# Patient Record
Sex: Female | Born: 1985 | Race: White | Hispanic: No | Marital: Single | Smoking: Former smoker
Health system: Southern US, Community
[De-identification: ages and names within clinical notes are randomized; demographics above are authoritative.]

## PROBLEM LIST (undated history)

## (undated) DIAGNOSIS — T4145XA Adverse effect of unspecified anesthetic, initial encounter: Secondary | ICD-10-CM

## (undated) DIAGNOSIS — T8859XA Other complications of anesthesia, initial encounter: Secondary | ICD-10-CM

## (undated) DIAGNOSIS — Z87891 Personal history of nicotine dependence: Secondary | ICD-10-CM

## (undated) DIAGNOSIS — H913 Deaf nonspeaking, not elsewhere classified: Secondary | ICD-10-CM

## (undated) HISTORY — DX: Deaf nonspeaking, not elsewhere classified: H91.3

## (undated) HISTORY — DX: Adverse effect of unspecified anesthetic, initial encounter: T41.45XA

## (undated) HISTORY — DX: Other complications of anesthesia, initial encounter: T88.59XA

---
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
## 2007-03-14 ENCOUNTER — Other Ambulatory Visit: Admission: RE | Admit: 2007-03-14 | Discharge: 2007-03-14 | Payer: Self-pay | Admitting: Family Medicine

## 2008-04-11 ENCOUNTER — Other Ambulatory Visit: Admission: RE | Admit: 2008-04-11 | Discharge: 2008-04-11 | Payer: Self-pay | Admitting: Family Medicine

## 2009-03-19 ENCOUNTER — Other Ambulatory Visit: Admission: RE | Admit: 2009-03-19 | Discharge: 2009-03-19 | Payer: Self-pay | Admitting: Gynecology

## 2009-03-19 ENCOUNTER — Encounter: Payer: Self-pay | Admitting: Gynecology

## 2009-03-19 ENCOUNTER — Ambulatory Visit: Payer: Self-pay | Admitting: Gynecology

## 2009-04-14 ENCOUNTER — Ambulatory Visit: Payer: Self-pay | Admitting: Gynecology

## 2011-02-19 ENCOUNTER — Encounter (INDEPENDENT_AMBULATORY_CARE_PROVIDER_SITE_OTHER): Payer: BC Managed Care – PPO | Admitting: Gynecology

## 2011-02-19 ENCOUNTER — Other Ambulatory Visit (HOSPITAL_COMMUNITY)
Admission: RE | Admit: 2011-02-19 | Discharge: 2011-02-19 | Disposition: A | Discharge status: Home / Self Care | Payer: BC Managed Care – PPO | Source: Ambulatory Visit | Attending: Gynecology | Admitting: Gynecology

## 2011-02-19 ENCOUNTER — Other Ambulatory Visit: Payer: Self-pay | Admitting: Gynecology

## 2011-02-19 DIAGNOSIS — R635 Abnormal weight gain: Secondary | ICD-10-CM

## 2011-02-19 DIAGNOSIS — B373 Candidiasis of vulva and vagina: Secondary | ICD-10-CM

## 2011-02-19 DIAGNOSIS — Z13220 Encounter for screening for lipoid disorders: Secondary | ICD-10-CM

## 2011-02-19 DIAGNOSIS — Z124 Encounter for screening for malignant neoplasm of cervix: Secondary | ICD-10-CM | POA: Insufficient documentation

## 2011-02-19 DIAGNOSIS — Z833 Family history of diabetes mellitus: Secondary | ICD-10-CM

## 2011-02-19 DIAGNOSIS — R82998 Other abnormal findings in urine: Secondary | ICD-10-CM

## 2011-02-19 DIAGNOSIS — Z01419 Encounter for gynecological examination (general) (routine) without abnormal findings: Secondary | ICD-10-CM

## 2011-02-22 ENCOUNTER — Encounter: Payer: Self-pay | Admitting: *Deleted

## 2012-02-21 ENCOUNTER — Other Ambulatory Visit: Payer: Self-pay | Admitting: Gynecology

## 2012-02-21 ENCOUNTER — Encounter: Payer: Self-pay | Admitting: Gynecology

## 2012-02-21 ENCOUNTER — Ambulatory Visit (INDEPENDENT_AMBULATORY_CARE_PROVIDER_SITE_OTHER): Payer: BC Managed Care – PPO | Admitting: Gynecology

## 2012-02-21 VITALS — Ht 65.0 in | Wt 183.0 lb

## 2012-02-21 DIAGNOSIS — IMO0001 Reserved for inherently not codable concepts without codable children: Secondary | ICD-10-CM

## 2012-02-21 DIAGNOSIS — Z309 Encounter for contraceptive management, unspecified: Secondary | ICD-10-CM

## 2012-02-21 DIAGNOSIS — Z113 Encounter for screening for infections with a predominantly sexual mode of transmission: Secondary | ICD-10-CM

## 2012-02-21 DIAGNOSIS — Z30432 Encounter for removal of intrauterine contraceptive device: Secondary | ICD-10-CM

## 2012-02-21 LAB — RPR

## 2012-02-21 LAB — HIV ANTIBODY (ROUTINE TESTING W REFLEX): HIV: NONREACTIVE

## 2012-02-21 LAB — HEPATITIS C ANTIBODY: HCV Ab: NEGATIVE

## 2012-02-21 NOTE — Patient Instructions (Signed)

## 2012-02-21 NOTE — Progress Notes (Signed)
Patient is a 26 year old presented to the office today requesting to have her Mirena IUD removed which had been placed in 2010. Patient wanted discuss alternative forms of contraception. There was question  in the past whether she had an allergic reaction to oral contraceptive pill but it is not clear. She has done well with a Mirena otherwise but states that she been to the emergency room several times in [place] where she goes to school and was found that she had ovarian cyst. Patient also wanted to have an STD screen today.  Exam: Bartholin urethra Skene glands: Within normal limits Vagina: No gross lesions on inspection Cervix: Friable IUD string seen Uterus: Anteverted normal size shape and consistency Adnexa: No palpable masses or tenderness Rectal: Not examined  The cervix was cleansed with Betadine solution and a [place] clamp was used to grasp the IUD string was retrieved and shown to the patient and discarded.  A wet prep along with a GC and Chlamydia culture were obtained as well as urinalysis. The wet prep was otherwise negative and GC and Chlamydia culture pending at time of this dictation  An HIV, RPR, hepatitis B and C. will be drawn as well to complete the STD screen. Will notify the patient with the results when it becomes available and in the next couple days. Her urinalysis today: 7-10 RBC and few bacteria.  We discussed the NuvaRing although it does have estrogen and she is willing to give it a try and see. Instructions were provided on insertion and removal which she did in the office as well also literature information was provided. She is due for a Pap smear and next year. 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

## 2012-02-22 LAB — WET PREP FOR TRICH, YEAST, CLUE: Clue Cells Wet Prep HPF POC: NONE SEEN

## 2012-02-22 LAB — URINALYSIS W MICROSCOPIC + REFLEX CULTURE
Casts: NONE SEEN
Glucose, UA: NEGATIVE mg/dL
Leukocytes, UA: NEGATIVE
Urobilinogen, UA: 0.2 mg/dL (ref 0.0–1.0)
WBC, UA: NONE SEEN WBC/hpf (ref <3)

## 2012-02-24 LAB — URINE CULTURE
Colony Count: NO GROWTH
Organism ID, Bacteria: NO GROWTH

## 2013-03-15 ENCOUNTER — Ambulatory Visit (INDEPENDENT_AMBULATORY_CARE_PROVIDER_SITE_OTHER): Payer: BC Managed Care – PPO | Admitting: Gynecology

## 2013-03-15 ENCOUNTER — Other Ambulatory Visit (HOSPITAL_COMMUNITY)
Admission: RE | Admit: 2013-03-15 | Discharge: 2013-03-15 | Disposition: A | Discharge status: Home / Self Care | Payer: BC Managed Care – PPO | Source: Ambulatory Visit | Attending: Gynecology | Admitting: Gynecology

## 2013-03-15 ENCOUNTER — Encounter: Payer: Self-pay | Admitting: Gynecology

## 2013-03-15 VITALS — BP 122/70 | Ht 65.0 in | Wt 181.0 lb

## 2013-03-15 DIAGNOSIS — Z3009 Encounter for other general counseling and advice on contraception: Secondary | ICD-10-CM

## 2013-03-15 DIAGNOSIS — Z01419 Encounter for gynecological examination (general) (routine) without abnormal findings: Secondary | ICD-10-CM

## 2013-03-15 DIAGNOSIS — Z113 Encounter for screening for infections with a predominantly sexual mode of transmission: Secondary | ICD-10-CM

## 2013-03-15 LAB — WET PREP FOR TRICH, YEAST, CLUE
Clue Cells Wet Prep HPF POC: NONE SEEN
Trich, Wet Prep: NONE SEEN

## 2013-03-15 NOTE — Addendum Note (Signed)
Addended by: Bertram Savin A on: 03/15/2013 03:17 PM   Modules accepted: Orders

## 2013-03-15 NOTE — Patient Instructions (Addendum)

## 2013-03-15 NOTE — Addendum Note (Signed)
Addended by: Bertram Savin A on: 03/15/2013 02:49 PM   Modules accepted: Orders
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
## 2013-03-16 LAB — URINALYSIS W MICROSCOPIC + REFLEX CULTURE
Casts: NONE SEEN
Crystals: NONE SEEN
Glucose, UA: NEGATIVE mg/dL
Hgb urine dipstick: NEGATIVE
Ketones, ur: NEGATIVE mg/dL
Nitrite: NEGATIVE
Protein, ur: NEGATIVE mg/dL
Specific Gravity, Urine: 1.019 (ref 1.005–1.030)
Urobilinogen, UA: 0.2 mg/dL (ref 0.0–1.0)
pH: 7 (ref 5.0–8.0)

## 2013-03-16 LAB — CBC WITH DIFFERENTIAL/PLATELET
Eosinophils Relative: 1 % (ref 0–5)
HCT: 42.3 % (ref 36.0–46.0)
MCH: 29.8 pg (ref 26.0–34.0)
MCV: 85.1 fL (ref 78.0–100.0)
Neutrophils Relative %: 56 % (ref 43–77)
Platelets: 185 10*3/uL (ref 150–400)

## 2013-03-21 ENCOUNTER — Encounter: Payer: Self-pay | Admitting: Gynecology

## 2013-06-21 ENCOUNTER — Other Ambulatory Visit: Payer: Self-pay

## 2014-08-30 ENCOUNTER — Encounter: Payer: Self-pay | Admitting: Gynecology

## 2014-08-30 ENCOUNTER — Ambulatory Visit (INDEPENDENT_AMBULATORY_CARE_PROVIDER_SITE_OTHER): Payer: PPO | Admitting: Gynecology

## 2014-08-30 VITALS — BP 128/76

## 2014-08-30 DIAGNOSIS — Z3200 Encounter for pregnancy test, result unknown: Secondary | ICD-10-CM

## 2014-08-30 DIAGNOSIS — O26859 Spotting complicating pregnancy, unspecified trimester: Secondary | ICD-10-CM

## 2014-08-30 DIAGNOSIS — O26851 Spotting complicating pregnancy, first trimester: Secondary | ICD-10-CM

## 2014-08-30 DIAGNOSIS — N912 Amenorrhea, unspecified: Secondary | ICD-10-CM

## 2014-08-30 LAB — PREGNANCY, URINE: PREG TEST UR: POSITIVE

## 2014-08-30 NOTE — Patient Instructions (Signed)
First Trimester of Pregnancy The first trimester of pregnancy is from week 1 until the end of week 12 (months 1 through 3). A week after a sperm fertilizes an egg, the egg will implant on the wall of the uterus. This embryo will begin to develop into a baby. Genes from you and your partner are forming the baby. The female genes determine whether the baby is a boy or a girl. At 6-8 weeks, the eyes and face are formed, and the heartbeat can be seen on ultrasound. At the end of 12 weeks, all the baby's organs are formed.  Now that you are pregnant, you will want to do everything you can to have a healthy baby. Two of the most important things are to get good prenatal care and to follow your health care provider's instructions. Prenatal care is all the medical care you receive before the baby's birth. This care will help prevent, find, and treat any problems during the pregnancy and childbirth. BODY CHANGES Your body goes through many changes during pregnancy. The changes vary from woman to woman.   You may gain or lose a couple of pounds at first.  You may feel sick to your stomach (nauseous) and throw up (vomit). If the vomiting is uncontrollable, call your health care provider.  You may tire easily.  You may develop headaches that can be relieved by medicines approved by your health care provider.  You may urinate more often. Painful urination may mean you have a bladder infection.  You may develop heartburn as a result of your pregnancy.  You may develop constipation because certain hormones are causing the muscles that push waste through your intestines to slow down.  You may develop hemorrhoids or swollen, bulging veins (varicose veins).  Your breasts may begin to grow larger and become tender. Your nipples may stick out more, and the tissue that surrounds them (areola) may become darker.  Your gums may bleed and may be sensitive to brushing and flossing.  Dark spots or blotches (chloasma,  mask of pregnancy) may develop on your face. This will likely fade after the baby is born.  Your menstrual periods will stop.  You may have a loss of appetite.  You may develop cravings for certain kinds of food.  You may have changes in your emotions from day to day, such as being excited to be pregnant or being concerned that something may go wrong with the pregnancy and baby.  You may have more vivid and strange dreams.  You may have changes in your hair. These can include thickening of your hair, rapid growth, and changes in texture. Some women also have hair loss during or after pregnancy, or hair that feels dry or thin. Your hair will most likely return to normal after your baby is born. WHAT TO EXPECT AT YOUR PRENATAL VISITS During a routine prenatal visit:  You will be weighed to make sure you and the baby are growing normally.  Your blood pressure will be taken.  Your abdomen will be measured to track your baby's growth.  The fetal heartbeat will be listened to starting around week 10 or 12 of your pregnancy.  Test results from any previous visits will be discussed. Your health care provider may ask you:  How you are feeling.  If you are feeling the baby move.  If you have had any abnormal symptoms, such as leaking fluid, bleeding, severe headaches, or abdominal cramping.  If you have any questions. Other tests   that may be performed during your first trimester include:  Blood tests to find your blood type and to check for the presence of any previous infections. They will also be used to check for low iron levels (anemia) and Rh antibodies. Later in the pregnancy, blood tests for diabetes will be done along with other tests if problems develop.  Urine tests to check for infections, diabetes, or protein in the urine.  An ultrasound to confirm the proper growth and development of the baby.  An amniocentesis to check for possible genetic problems.  Fetal screens for  spina bifida and Down syndrome.  You may need other tests to make sure you and the baby are doing well. HOME CARE INSTRUCTIONS  Medicines  Follow your health care provider's instructions regarding medicine use. Specific medicines may be either safe or unsafe to take during pregnancy.  Take your prenatal vitamins as directed.  If you develop constipation, try taking a stool softener if your health care provider approves. Diet  Eat regular, well-balanced meals. Choose a variety of foods, such as meat or vegetable-based protein, fish, milk and low-fat dairy products, vegetables, fruits, and whole grain breads and cereals. Your health care provider will help you determine the amount of weight gain that is right for you.  Avoid raw meat and uncooked cheese. These carry germs that can cause birth defects in the baby.  Eating four or five small meals rather than three large meals a day may help relieve nausea and vomiting. If you start to feel nauseous, eating a few soda crackers can be helpful. Drinking liquids between meals instead of during meals also seems to help nausea and vomiting.  If you develop constipation, eat more high-fiber foods, such as fresh vegetables or fruit and whole grains. Drink enough fluids to keep your urine clear or pale yellow. Activity and Exercise  Exercise only as directed by your health care provider. Exercising will help you:  Control your weight.  Stay in shape.  Be prepared for labor and delivery.  Experiencing pain or cramping in the lower abdomen or low back is a good sign that you should stop exercising. Check with your health care provider before continuing normal exercises.  Try to avoid standing for long periods of time. Move your legs often if you must stand in one place for a long time.  Avoid heavy lifting.  Wear low-heeled shoes, and practice good posture.  You may continue to have sex unless your health care provider directs you  otherwise. Relief of Pain or Discomfort  Wear a good support bra for breast tenderness.   Take warm sitz baths to soothe any pain or discomfort caused by hemorrhoids. Use hemorrhoid cream if your health care provider approves.   Rest with your legs elevated if you have leg cramps or low back pain.  If you develop varicose veins in your legs, wear support hose. Elevate your feet for 15 minutes, 3-4 times a day. Limit salt in your diet. Prenatal Care  Schedule your prenatal visits by the twelfth week of pregnancy. They are usually scheduled monthly at first, then more often in the last 2 months before delivery.  Write down your questions. Take them to your prenatal visits.  Keep all your prenatal visits as directed by your health care provider. Safety  Wear your seat belt at all times when driving.  Make a list of emergency phone numbers, including numbers for family, friends, the hospital, and police and fire departments. General Tips    Ask your health care provider for a referral to a local prenatal education class. Begin classes no later than at the beginning of month 6 of your pregnancy.  Ask for help if you have counseling or nutritional needs during pregnancy. Your health care provider can offer advice or refer you to specialists for help with various needs.  Do not use hot tubs, steam rooms, or saunas.  Do not douche or use tampons or scented sanitary pads.  Do not cross your legs for long periods of time.  Avoid cat litter boxes and soil used by cats. These carry germs that can cause birth defects in the baby and possibly loss of the fetus by miscarriage or stillbirth.  Avoid all smoking, herbs, alcohol, and medicines not prescribed by your health care provider. Chemicals in these affect the formation and growth of the baby.  Schedule a dentist appointment. At home, brush your teeth with a soft toothbrush and be gentle when you floss. SEEK MEDICAL CARE IF:   You have  dizziness.  You have mild pelvic cramps, pelvic pressure, or nagging pain in the abdominal area.  You have persistent nausea, vomiting, or diarrhea.  You have a bad smelling vaginal discharge.  You have pain with urination.  You notice increased swelling in your face, hands, legs, or ankles. SEEK IMMEDIATE MEDICAL CARE IF:   You have a fever.  You are leaking fluid from your vagina.  You have spotting or bleeding from your vagina.  You have severe abdominal cramping or pain.  You have rapid weight gain or loss.  You vomit blood or material that looks like coffee grounds.  You are exposed to German measles and have never had them.  You are exposed to fifth disease or chickenpox.  You develop a severe headache.  You have shortness of breath.  You have any kind of trauma, such as from a fall or a car accident. Document Released: 07/27/2001 Document Revised: 12/17/2013 Document Reviewed: 06/12/2013 Exit[employer] Patient Information 2015 [employer]. This information is not intended to replace advice given to you by your health care provider. Make sure you discuss any questions you have with your health care provider.  
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
## 2014-08-31 LAB — HCG, QUANTITATIVE, PREGNANCY: hCG, Beta Chain, Quant, S: 986.1 m[IU]/mL

## 2014-09-10 ENCOUNTER — Telehealth: Payer: Self-pay

## 2014-09-10 ENCOUNTER — Other Ambulatory Visit: Payer: PPO

## 2014-09-10 ENCOUNTER — Encounter: Payer: Self-pay | Admitting: Gynecology

## 2014-09-10 DIAGNOSIS — O26859 Spotting complicating pregnancy, unspecified trimester: Secondary | ICD-10-CM

## 2014-09-10 DIAGNOSIS — Z3200 Encounter for pregnancy test, result unknown: Secondary | ICD-10-CM

## 2014-09-10 DIAGNOSIS — N912 Amenorrhea, unspecified: Secondary | ICD-10-CM

## 2014-09-10 NOTE — Telephone Encounter (Signed)
Patient called stating she is about [redacted] weeks pregnant and had some brown discharge this morning when she wiped and saw brown discharge again a little bit ago.  She said she has no pain.  I reassure her that this can be normal in first trimester pregnancy.  She was planning to come in this afternoon to have her 2nd quant drawn per Dr. Glenetta HewJF at last visit and she has u/s scheduled this Friday.  I reassure her about the brown discharge and told her to come on today for repeat quan and keep u/s Friday.  I assured her I would run all this by Dr. Audie BoxFontaine and would call her back to reassure her.

## 2014-09-10 NOTE — Telephone Encounter (Signed)
I called patient back but through interpretor was informed she would have to call me back.

## 2014-09-10 NOTE — Telephone Encounter (Signed)
Sounds appropriate.

## 2014-09-11 LAB — HCG, QUANTITATIVE, PREGNANCY: HCG, BETA CHAIN, QUANT, S: 28889.4 m[IU]/mL

## 2014-09-11 NOTE — Telephone Encounter (Signed)
Patient called back. Reassured. Rebecca ButtersQuant today looks good and she will keep appt for u/s on Friday. Will call us if she needs us.

## 2014-09-13 ENCOUNTER — Ambulatory Visit: Payer: PPO | Admitting: Gynecology

## 2014-09-13 ENCOUNTER — Other Ambulatory Visit: Payer: PPO

## 2014-09-13 ENCOUNTER — Ambulatory Visit (INDEPENDENT_AMBULATORY_CARE_PROVIDER_SITE_OTHER): Payer: PPO | Admitting: Gynecology

## 2014-09-13 ENCOUNTER — Other Ambulatory Visit: Payer: Self-pay | Admitting: Gynecology

## 2014-09-13 ENCOUNTER — Ambulatory Visit (INDEPENDENT_AMBULATORY_CARE_PROVIDER_SITE_OTHER): Payer: PPO

## 2014-09-13 ENCOUNTER — Encounter: Payer: Self-pay | Admitting: Gynecology

## 2014-09-13 DIAGNOSIS — O200 Threatened abortion: Secondary | ICD-10-CM | POA: Diagnosis not present

## 2014-09-13 DIAGNOSIS — Z3A01 Less than 8 weeks gestation of pregnancy: Secondary | ICD-10-CM | POA: Diagnosis not present

## 2014-09-13 DIAGNOSIS — O26859 Spotting complicating pregnancy, unspecified trimester: Secondary | ICD-10-CM

## 2014-09-13 DIAGNOSIS — O3680X1 Pregnancy with inconclusive fetal viability, fetus 1: Secondary | ICD-10-CM

## 2014-09-13 DIAGNOSIS — O26851 Spotting complicating pregnancy, first trimester: Secondary | ICD-10-CM

## 2014-09-13 DIAGNOSIS — Z3490 Encounter for supervision of normal pregnancy, unspecified, unspecified trimester: Secondary | ICD-10-CM

## 2014-09-13 LAB — US OB TRANSVAGINAL

## 2014-09-13 NOTE — Progress Notes (Signed)
   Patient is a 29 year old now gravida77 1 para 0 who was seen in the office for the first time as a new patient in 08/30/2014. Patient is deaf mute and was here with an interpreter as well as her partner. She had a last menstrual period December 15 and had a home urine pregnancy test that was positive and was confirmed here in our office on the last office visit. Her quantitative beta-hCGs have been as follows:  Results for Rebecca Schmitt, [name] (MRN [id number]) as of 09/13/2014 12:40  Ref. Range 08/30/2014 11:38 09/10/2014 15:13  hCG, Beta Chain, Quant, S Latest Units: mIU/mL 986.1 28889.4    She is asymptomatic. She started her prenatal vitamins. Her ultrasound today as follows:  A gestational sac with fetal pole annual sac and cardiac activity were noted. Both ovaries normal. No fluid in the cul-de-sac.  Assessment/plan: First trimester pregnancy. Last menstrual period December 15 would place patient today at 6 weeks and 3 days with an estimated date of confinement 05/06/2015 ultrasound coincides with dates. Patient will be referred to my obstetrical colleagues for prenatal care. Instructions provided.

## 2014-10-17 ENCOUNTER — Telehealth: Payer: Self-pay | Admitting: *Deleted

## 2014-10-17 NOTE — Telephone Encounter (Signed)
Patient is pregnant and has verified her pregnancy. Patient is approximately [redacted] weeks pregnant. Patient is interested in starting prenatal care in our office. Patient is deaf. Patient has been scheduled for a NOB appointment on 10-18-14 @ 2 pm. Patient advised of no show policy and expresses understanding. Reviewed with patient what to expect at the appointment.

## 2014-10-18 ENCOUNTER — Ambulatory Visit (INDEPENDENT_AMBULATORY_CARE_PROVIDER_SITE_OTHER): Payer: PPO | Admitting: Certified Nurse Midwife

## 2014-10-18 ENCOUNTER — Encounter: Payer: Self-pay | Admitting: Certified Nurse Midwife

## 2014-10-18 VITALS — BP 130/81 | HR 99 | Temp 99.4°F | Ht 64.5 in | Wt 221.0 lb

## 2014-10-18 DIAGNOSIS — Z3491 Encounter for supervision of normal pregnancy, unspecified, first trimester: Secondary | ICD-10-CM

## 2014-10-18 DIAGNOSIS — H913 Deaf nonspeaking, not elsewhere classified: Secondary | ICD-10-CM | POA: Insufficient documentation

## 2014-10-18 DIAGNOSIS — Z3401 Encounter for supervision of normal first pregnancy, first trimester: Secondary | ICD-10-CM

## 2014-10-18 LAB — POCT URINALYSIS DIPSTICK
BILIRUBIN UA: NEGATIVE
GLUCOSE UA: NEGATIVE
Ketones, UA: NEGATIVE
Nitrite, UA: NEGATIVE
RBC UA: 50
Spec Grav, UA: 1.02
UROBILINOGEN UA: NEGATIVE
pH, UA: 6

## 2014-10-18 NOTE — Progress Notes (Signed)
Subjective:    Rebecca Schmitt is being seen today for her first obstetrical visit.  This is not a planned pregnancy. She is at [redacted]w[redacted]d gestation. Her obstetrical history is significant for obesity and deaf, along with spouse who is deaf and has CP. Relationship with FOB: spouse, living together. Patient does intend to breast feed. Pregnancy history fully reviewed.  Interpreter present for exam.    The information documented in the HPI was reviewed and verified.  Menstrual History: OB History    Gravida Para Term Preterm AB TAB SAB Ectopic Multiple Living   1               Menarche age: 77  Patient's last menstrual period was 07/30/2014.    Past Medical History  Diagnosis Date  . Deaf-mutism   . Complication of anesthesia     wakes up     History reviewed. No pertinent past surgical history.   (Not in a hospital admission) Allergies  Allergen Reactions  . Estrogens Hives    Allergy not certain    History  Substance Use Topics  . Smoking status: Former Smoker    Quit date: 04/19/2014  . Smokeless tobacco: Never Used  . Alcohol Use: No    Family History  Problem Relation Age of Onset  . Hypertension Father   . Hypertension Mother      Review of Systems Constitutional: negative for weight loss Gastrointestinal: negative for vomiting Genitourinary:negative for genital lesions and vaginal discharge and dysuria Musculoskeletal:negative for back pain Behavioral/Psych: negative for abusive relationship, depression, illegal drug usage and tobacco use    Objective:    BP 130/81 mmHg  Pulse 99  Temp(Src) 99.4 F (37.4 C)  Ht 5' 4.5" (1.638 m)  Wt 100.245 kg (221 lb)  BMI 37.36 kg/m2  LMP 07/30/2014 General Appearance:    Alert, cooperative, no distress, appears stated age  Head:    Normocephalic, without obvious abnormality, atraumatic  Eyes:    PERRL, conjunctiva/corneas clear, EOM's intact, fundi    benign, both eyes  Ears:    Normal TM's and external ear canals,  both ears  Nose:   Nares normal, septum midline, mucosa normal, no drainage    or sinus tenderness  Throat:   Lips, mucosa, and tongue normal; teeth and gums normal  Neck:   Supple, symmetrical, trachea midline, no adenopathy;    thyroid:  no enlargement/tenderness/nodules; no carotid   bruit or JVD  Back:     Symmetric, no curvature, ROM normal, no CVA tenderness  Lungs:     Clear to auscultation bilaterally, respirations unlabored  Chest Wall:    No tenderness or deformity   Heart:    Regular rate and rhythm, S1 and S2 normal, no murmur, rub   or gallop  Breast Exam:    No tenderness, masses, or nipple abnormality  Abdomen:     Soft, non-tender, bowel sounds active all four quadrants,    no masses, no organomegaly  Genitalia:    Normal female without lesion, discharge or tenderness  Extremities:   Extremities normal, atraumatic, no cyanosis or edema  Pulses:   2+ and symmetric all extremities  Skin:   Skin color, texture, turgor normal, no rashes or lesions  Lymph nodes:   Cervical, supraclavicular, and axillary nodes normal  Neurologic:   CNII-XII intact, normal strength, sensation and reflexes    throughout      Lab Review Urine pregnancy test Labs reviewed yes Radiologic studies reviewed yes Assessment:  Pregnancy at 6475w3d weeks confirmed with U/S dating @ 6 weeks.    Plan:      Prenatal vitamins.  Counseling provided regarding continued use of seat belts, cessation of alcohol consumption, smoking or use of illicit drugs; infection precautions i.e., influenza/TDAP immunizations, toxoplasmosis,CMV, parvovirus, listeria and varicella; workplace safety, exercise during pregnancy; routine dental care, safe medications, sexual activity, hot tubs, saunas, pools, travel, caffeine use, fish and methlymercury, potential toxins, hair treatments, varicose veins Weight gain recommendations per IOM guidelines reviewed: underweight/BMI< 18.5--> gain 28 - 40 lbs; normal weight/BMI 18.5 -  24.9--> gain 25 - 35 lbs; overweight/BMI 25 - 29.9--> gain 15 - 25 lbs; obese/BMI >30->gain  11 - 20 lbs Problem list reviewed and updated. FIRST/CF mutation testing/NIPT/QUAD SCREEN/fragile X/Ashkenazi Jewish population testing/Spinal muscular atrophy discussed: requested. Role of ultrasound in pregnancy discussed; fetal survey: requested. Amniocentesis discussed: not indicated. VBAC calculator score: VBAC consent form provided No orders of the defined types were placed in this encounter.   Orders Placed This Encounter  Procedures  . Culture, OB Urine  . SureSwab, Vaginosis/Vaginitis Plus  . Obstetric panel  . HIV antibody  . Hemoglobinopathy evaluation  . Varicella zoster antibody, IgG  . Vit D  25 hydroxy (rtn osteoporosis monitoring)  . POCT urinalysis dipstick    Follow up in 4 weeks. 50% of 30 min visit spent on counseling and coordination of care.

## 2014-10-21 LAB — PAP IG W/ RFLX HPV ASCU

## 2014-10-22 ENCOUNTER — Other Ambulatory Visit: Payer: Self-pay | Admitting: Certified Nurse Midwife

## 2014-10-22 LAB — SURESWAB, VAGINOSIS/VAGINITIS PLUS
ATOPOBIUM VAGINAE: NOT DETECTED Log (cells/mL)
C. ALBICANS, DNA: NOT DETECTED
C. PARAPSILOSIS, DNA: NOT DETECTED
C. TRACHOMATIS RNA, TMA: NOT DETECTED
C. glabrata, DNA: NOT DETECTED
C. tropicalis, DNA: NOT DETECTED
Gardnerella vaginalis: NOT DETECTED Log (cells/mL)
LACTOBACILLUS SPECIES: 5.4 Log (cells/mL)
MEGASPHAERA SPECIES: NOT DETECTED Log (cells/mL)
N. GONORRHOEAE RNA, TMA: NOT DETECTED
T. vaginalis RNA, QL TMA: NOT DETECTED

## 2014-10-22 LAB — CULTURE, OB URINE

## 2014-10-28 ENCOUNTER — Other Ambulatory Visit: Payer: Self-pay | Admitting: *Deleted

## 2014-10-28 DIAGNOSIS — N390 Urinary tract infection, site not specified: Secondary | ICD-10-CM

## 2014-10-28 MED ORDER — AMOXICILLIN-POT CLAVULANATE 875-125 MG PO TABS: 1.0000 | ORAL_TABLET | Freq: Two times a day (BID) | ORAL | Status: DC

## 2014-10-28 NOTE — Progress Notes (Signed)
Antibiotic sent to pharmacy for UTI 

## 2014-11-15 ENCOUNTER — Ambulatory Visit (INDEPENDENT_AMBULATORY_CARE_PROVIDER_SITE_OTHER): Payer: PPO | Admitting: Certified Nurse Midwife

## 2014-11-15 VITALS — BP 134/80 | HR 99 | Wt 227.0 lb

## 2014-11-15 DIAGNOSIS — Z3402 Encounter for supervision of normal first pregnancy, second trimester: Secondary | ICD-10-CM

## 2014-11-15 LAB — POCT URINALYSIS DIPSTICK
Bilirubin, UA: NEGATIVE
Glucose, UA: NEGATIVE
Ketones, UA: NEGATIVE
LEUKOCYTES UA: NEGATIVE
Nitrite, UA: NEGATIVE
PH UA: 7.5
PROTEIN UA: NEGATIVE
Spec Grav, UA: 1.01
UROBILINOGEN UA: NEGATIVE

## 2014-11-15 MED ORDER — ALBUTEROL SULFATE HFA 108 (90 BASE) MCG/ACT IN AERS: 2.0000 | INHALATION_SPRAY | Freq: Four times a day (QID) | RESPIRATORY_TRACT | Status: DC | PRN

## 2014-11-15 NOTE — Progress Notes (Signed)
  Subjective:    Rebecca Schmitt is a 29 y.o. female being seen today for her obstetrical visit. She is at 8475w3d gestation. Patient reports: no complaints.  Is not feeling fetal movement yet.  Does have allergies/asthma.  Discussed OTC products with patient.    Problem List Items Addressed This Visit    None    Visit Diagnoses    Encounter for prenatal care of first pregnancy, second trimester    -  Primary    Relevant Orders    US OB Comp + 14 Wk      Patient Active Problem List   Diagnosis Date Noted  . Deaf, nonspeaking 10/18/2014    Objective:     BP 134/80 mmHg  Pulse 99  Wt 102.967 kg (227 lb)  LMP 07/30/2014 Uterine Size: Below umbilicus   FHR: 150's  Assessment:    Pregnancy @ 1775w3d  weeks Doing well    Plan:    Problem list reviewed and updated. Labs reviewed. Sure swab obtained.  Follow up in 4 weeks. FIRST/CF mutation testing/NIPT/QUAD SCREEN/fragile X/Ashkenazi Jewish population testing/Spinal muscular atrophy discussed: requested. Role of ultrasound in pregnancy discussed; fetal survey: ordered. Amniocentesis discussed: not indicated.

## 2014-11-15 NOTE — Addendum Note (Signed)
Addended by: Henriette CombsHATTON, ANDREA L on: 11/15/2014 02:50 PM   Modules accepted: Orders

## 2014-11-16 LAB — VITAMIN D 25 HYDROXY (VIT D DEFICIENCY, FRACTURES): Vit D, 25-Hydroxy: 18 ng/mL — ABNORMAL LOW (ref 30–100)

## 2014-11-16 LAB — HIV ANTIBODY (ROUTINE TESTING W REFLEX): HIV 1&2 Ab, 4th Generation: NONREACTIVE

## 2014-11-18 LAB — VARICELLA ZOSTER ANTIBODY, IGG: VARICELLA IGG: 409.9 {index} — AB (ref <135.00)

## 2014-11-18 LAB — OBSTETRIC PANEL
Antibody Screen: NEGATIVE
BASOS ABS: 0 10*3/uL (ref 0.0–0.1)
Basophils Relative: 0 % (ref 0–1)
EOS ABS: 0.1 10*3/uL (ref 0.0–0.7)
Eosinophils Relative: 1 % (ref 0–5)
HCT: 37.1 % (ref 36.0–46.0)
HEMOGLOBIN: 12.7 g/dL (ref 12.0–15.0)
HEP B S AG: NEGATIVE
LYMPHS ABS: 1.8 10*3/uL (ref 0.7–4.0)
LYMPHS PCT: 20 % (ref 12–46)
MCH: 29.5 pg (ref 26.0–34.0)
MCHC: 34.2 g/dL (ref 30.0–36.0)
MCV: 86.1 fL (ref 78.0–100.0)
MONOS PCT: 6 % (ref 3–12)
MPV: 10 fL (ref 8.6–12.4)
Monocytes Absolute: 0.5 10*3/uL (ref 0.1–1.0)
NEUTROS ABS: 6.5 10*3/uL (ref 1.7–7.7)
NEUTROS PCT: 73 % (ref 43–77)
PLATELETS: 150 10*3/uL (ref 150–400)
RBC: 4.31 MIL/uL (ref 3.87–5.11)
RDW: 13.6 % (ref 11.5–15.5)
RUBELLA: 3.23 {index} — AB (ref <0.90)
Rh Type: POSITIVE
WBC: 8.9 10*3/uL (ref 4.0–10.5)

## 2014-11-19 LAB — SURESWAB, VAGINOSIS/VAGINITIS PLUS
Atopobium vaginae: NOT DETECTED Log (cells/mL)
C. GLABRATA, DNA: NOT DETECTED
C. TRACHOMATIS RNA, TMA: NOT DETECTED
C. albicans, DNA: NOT DETECTED
C. parapsilosis, DNA: NOT DETECTED
C. tropicalis, DNA: NOT DETECTED
Gardnerella vaginalis: NOT DETECTED Log (cells/mL)
LACTOBACILLUS SPECIES: >8 Log (cells/mL)
MEGASPHAERA SPECIES: NOT DETECTED Log (cells/mL)
N. GONORRHOEAE RNA, TMA: NOT DETECTED
T. vaginalis RNA, QL TMA: NOT DETECTED

## 2014-11-20 ENCOUNTER — Other Ambulatory Visit: Payer: Self-pay | Admitting: Certified Nurse Midwife

## 2014-11-20 DIAGNOSIS — Z3491 Encounter for supervision of normal pregnancy, unspecified, first trimester: Secondary | ICD-10-CM

## 2014-11-20 LAB — AFP, QUAD SCREEN
AFP: 16.7 ng/mL
Curr Gest Age: 15.3 wks.days
Down Syndrome Scr Risk Est: 1:18600 {titer}
HCG, Total: 17.04 IU/mL
INH: 83.3 pg/mL
Interpretation-AFP: NEGATIVE
MOM FOR HCG: 0.48
MoM for AFP: 0.7
MoM for INH: 0.63
Open Spina bifida: NEGATIVE
Tri 18 Scr Risk Est: NEGATIVE
uE3 Mom: 0.51
uE3 Value: 0.32 ng/mL

## 2014-11-20 MED ORDER — PRENATE MINI 18-0.6-0.4-350 MG PO CAPS: 1.0000 | ORAL_CAPSULE | Freq: Every day | ORAL | Status: DC

## 2014-11-21 ENCOUNTER — Encounter: Payer: Self-pay | Admitting: *Deleted

## 2014-11-26 NOTE — Telephone Encounter (Signed)
Pt called, via interpreter service, to inquire about PNV and lab results.  Pt made aware that her vitamin d level was slightly low and needs to have a supplement.  Pt made aware that a new PNV had been sent to her pharmacy that has recommended amount of vitamin d for pregnancy.  Pt advised that she may stop taking her current PNV and start on Prenate Mini that was ordered.  Pt states understanding.  Pt advised to call with any other concerns.

## 2014-11-29 ENCOUNTER — Telehealth: Payer: Self-pay

## 2014-11-29 NOTE — Telephone Encounter (Signed)
Resch patient's u/s and rob appt to 4/27

## 2014-12-04 ENCOUNTER — Other Ambulatory Visit: Payer: Self-pay | Admitting: Certified Nurse Midwife

## 2014-12-04 ENCOUNTER — Other Ambulatory Visit: Payer: PPO

## 2014-12-04 DIAGNOSIS — Z1389 Encounter for screening for other disorder: Secondary | ICD-10-CM

## 2014-12-11 ENCOUNTER — Ambulatory Visit (INDEPENDENT_AMBULATORY_CARE_PROVIDER_SITE_OTHER): Payer: PPO | Admitting: Certified Nurse Midwife

## 2014-12-11 ENCOUNTER — Ambulatory Visit (INDEPENDENT_AMBULATORY_CARE_PROVIDER_SITE_OTHER): Payer: PPO

## 2014-12-11 VITALS — BP 124/76 | HR 101 | Temp 99.5°F | Wt 234.0 lb

## 2014-12-11 DIAGNOSIS — Z3402 Encounter for supervision of normal first pregnancy, second trimester: Secondary | ICD-10-CM

## 2014-12-11 DIAGNOSIS — Z36 Encounter for antenatal screening of mother: Secondary | ICD-10-CM | POA: Diagnosis not present

## 2014-12-11 DIAGNOSIS — Z1389 Encounter for screening for other disorder: Secondary | ICD-10-CM

## 2014-12-11 LAB — POCT URINALYSIS DIPSTICK
Bilirubin, UA: NEGATIVE
GLUCOSE UA: NEGATIVE
KETONES UA: NEGATIVE
Leukocytes, UA: NEGATIVE
Nitrite, UA: NEGATIVE
Protein, UA: NEGATIVE
RBC UA: 50
SPEC GRAV UA: 1.01
UROBILINOGEN UA: NEGATIVE
pH, UA: 7

## 2014-12-11 LAB — US OB COMP + 14 WK

## 2014-12-11 NOTE — Progress Notes (Signed)
Subjective:    Rebecca Schmitt is a 29 y.o. female being seen today for her obstetrical visit. She is at 6889w1d gestation. Patient reports: no bleeding, no contractions, no cramping, no leaking and occasional epistaxis . Fetal movement: patient does not feel movement yet, FHR reassuring.  Interpreter present for exam.   Problem List Items Addressed This Visit    None    Visit Diagnoses    Encounter for supervision of normal first pregnancy in second trimester    -  Primary    Relevant Orders    POCT urinalysis dipstick (Completed)      Patient Active Problem List   Diagnosis Date Noted  . Deaf, nonspeaking 10/18/2014   Objective:    BP 124/76 mmHg  Pulse 101  Temp(Src) 99.5 F (37.5 C)  Wt 106.142 kg (234 lb)  LMP 07/30/2014 FHT: 150's BPM  Uterine Size: size equals dates and less than U   Has gained 13.2 lbs so far this pregnancy.    Assessment:    Pregnancy @ 889w1d    Doing well Plan:    OBGCT: discussed. Signs and symptoms of preterm labor: discussed. encouraged OTC saline spray Discussed weight gain in pregnancy and reviewed her diet, encouraged more fruits/vegtables.  Labs, problem list reviewed and updated 2 hr GTT planned Follow up in 4 weeks.

## 2014-12-13 ENCOUNTER — Encounter: Payer: PPO | Admitting: Certified Nurse Midwife

## 2015-01-08 ENCOUNTER — Ambulatory Visit (INDEPENDENT_AMBULATORY_CARE_PROVIDER_SITE_OTHER): Payer: PPO | Admitting: Certified Nurse Midwife

## 2015-01-08 VITALS — BP 126/76 | HR 101 | Temp 98.4°F | Wt 235.0 lb

## 2015-01-08 DIAGNOSIS — Z3402 Encounter for supervision of normal first pregnancy, second trimester: Secondary | ICD-10-CM

## 2015-01-08 DIAGNOSIS — O2343 Unspecified infection of urinary tract in pregnancy, third trimester: Secondary | ICD-10-CM

## 2015-01-08 LAB — POCT URINALYSIS DIPSTICK
Bilirubin, UA: NEGATIVE
GLUCOSE UA: NEGATIVE
KETONES UA: NEGATIVE
Leukocytes, UA: NEGATIVE
Nitrite, UA: NEGATIVE
PH UA: 8
Protein, UA: NEGATIVE
Urobilinogen, UA: NEGATIVE

## 2015-01-08 MED ORDER — NITROFURANTOIN MONOHYD MACRO 100 MG PO CAPS: 100.0000 mg | ORAL_CAPSULE | Freq: Two times a day (BID) | ORAL | Status: AC

## 2015-01-08 NOTE — Progress Notes (Signed)
Subjective:    Rebecca Schmitt is a 29 y.o. female being seen today for her obstetrical visit. She is at 191w1d gestation. Patient reports: no complaints . Fetal movement: normal.  [name] is named after her grandfather that recently passed away.  Encouraged patient to seek counseling to deal with the grief process.  Interpreter present for the exam.   Problem List Items Addressed This Visit    None    Visit Diagnoses    Encounter for supervision of normal first pregnancy in second trimester    -  Primary    Relevant Orders    POCT urinalysis dipstick (Completed)    US OB Follow Up    UTI (urinary tract infection) during pregnancy, third trimester        Relevant Medications    nitrofurantoin, macrocrystal-monohydrate, (MACROBID) 100 MG capsule      Patient Active Problem List   Diagnosis Date Noted  . Deaf, nonspeaking 10/18/2014   Objective:    BP 126/76 mmHg  Pulse 101  Temp(Src) 98.4 F (36.9 C)  Wt 235 lb (106.595 kg)  LMP 07/30/2014 FHT: 152 BPM  Uterine Size: size equals dates     Assessment:    Pregnancy @ 671w1d    Doing well.   Plan:    OBGCT: discussed. Signs and symptoms of preterm labor: discussed. [facility] sub[facility]optimal fetal anatomy scan views.  Labs, problem list reviewed and updated 2 hr GTT planned for next OB visit.  Follow up in 4 weeks.

## 2015-01-17 ENCOUNTER — Other Ambulatory Visit: Payer: Self-pay | Admitting: Certified Nurse Midwife

## 2015-01-17 ENCOUNTER — Ambulatory Visit (HOSPITAL_COMMUNITY)
Admission: RE | Admit: 2015-01-17 | Discharge: 2015-01-17 | Disposition: A | Discharge status: Home / Self Care | Payer: Medicaid Other | Source: Ambulatory Visit | Attending: Certified Nurse Midwife | Admitting: Certified Nurse Midwife

## 2015-01-17 DIAGNOSIS — Z36 Encounter for antenatal screening of mother: Secondary | ICD-10-CM | POA: Insufficient documentation

## 2015-01-17 DIAGNOSIS — Z3A24 24 weeks gestation of pregnancy: Secondary | ICD-10-CM | POA: Diagnosis not present

## 2015-01-17 DIAGNOSIS — Z3402 Encounter for supervision of normal first pregnancy, second trimester: Secondary | ICD-10-CM

## 2015-02-05 ENCOUNTER — Telehealth: Payer: Self-pay

## 2015-02-05 ENCOUNTER — Other Ambulatory Visit: Payer: PPO

## 2015-02-05 ENCOUNTER — Ambulatory Visit (INDEPENDENT_AMBULATORY_CARE_PROVIDER_SITE_OTHER): Payer: PPO | Admitting: Certified Nurse Midwife

## 2015-02-05 VITALS — BP 134/77 | HR 104 | Wt 235.0 lb

## 2015-02-05 DIAGNOSIS — Z3402 Encounter for supervision of normal first pregnancy, second trimester: Secondary | ICD-10-CM | POA: Diagnosis not present

## 2015-02-05 LAB — CBC
HCT: 38.2 % (ref 36.0–46.0)
Hemoglobin: 13 g/dL (ref 12.0–15.0)
MCH: 29.8 pg (ref 26.0–34.0)
MCHC: 34 g/dL (ref 30.0–36.0)
MCV: 87.6 fL (ref 78.0–100.0)
MPV: 9.9 fL (ref 8.6–12.4)
Platelets: 161 10*3/uL (ref 150–400)
RBC: 4.36 MIL/uL (ref 3.87–5.11)
RDW: 13.6 % (ref 11.5–15.5)
WBC: 11.4 10*3/uL — ABNORMAL HIGH (ref 4.0–10.5)

## 2015-02-05 LAB — POCT URINALYSIS DIPSTICK
Bilirubin, UA: NEGATIVE
Glucose, UA: NEGATIVE
Ketones, UA: NEGATIVE
Leukocytes, UA: NEGATIVE
Nitrite, UA: NEGATIVE
PH UA: 7
Protein, UA: NEGATIVE
RBC UA: 50
UROBILINOGEN UA: NEGATIVE

## 2015-02-05 NOTE — Telephone Encounter (Signed)
PATIENT HAS APPT WITH [facility] ON 6/28 - CALLED [facility] FOR DEAF INTERPRETER TO COME

## 2015-02-05 NOTE — Progress Notes (Signed)
Subjective:    Rebecca Schmitt is a 29 y.o. female being seen today for her obstetrical visit. She is at [redacted]w[redacted]d gestation. Patient reports no complaints. Fetal movement: normal. Interpreter present.    Problem List Items Addressed This Visit    None    Visit Diagnoses    Encounter for supervision of normal first pregnancy in second trimester    -  Primary    Relevant Orders    Glucose Tolerance, 2 Hours w/1 Hour    CBC    HIV antibody    RPR    POCT urinalysis dipstick (Completed)    US OB Limited      Patient Active Problem List   Diagnosis Date Noted  . Deaf, nonspeaking 10/18/2014   Objective:    BP 134/77 mmHg  Pulse 104  Wt 235 lb (106.595 kg)  LMP 07/30/2014 FHT:  143 BPM  Uterine Size: 30 cm and size greater than dates  Presentation: unsure   Depression score of 5.    Assessment:    Pregnancy @ [redacted]w[redacted]d weeks   Doing well.   Tobacco abuse Mild depression  Plan:     labs reviewed, problem list updated Consent signed. [facility] Referral Monitor depression.   TDAP offered  Rhogam given for RH negative Pediatrician: discussed. Infant feeding: plans to breastfeed. Maternity leave: N/A. Cigarette smoking: smokes 1/2 PPD.  Had quit has been smoking since loss of her grandfather a few months ago, off and on smoking.  Reports last cigarettes was on Monday.   Orders Placed This Encounter  Procedures  . US OB Limited    Standing Status: Future     Number of Occurrences:      Standing Expiration Date: 04/06/2016    Order Specific Question:  Reason for Exam (SYMPTOM  OR DIAGNOSIS REQUIRED)    Answer:  suboptimal fetal head views, unsure of fetal lie    Order Specific Question:  Preferred imaging location?    Answer:  Internal  . Glucose Tolerance, 2 Hours w/1 Hour  . CBC  . HIV antibody  . RPR  . POCT urinalysis dipstick   No orders of the defined types were placed in this encounter.   Follow up in 2 Weeks.

## 2015-02-05 NOTE — Patient Instructions (Signed)
Before [facility] Ask any questions about feeding, diapering, and baby care before you leave the hospital. Ask again if you do not understand. Ask when you need to see the doctor again. There are several things you must have before your baby comes home.  Infant car seat.  Crib.  Do not let your baby sleep in a bed with you or anyone else.  If you do not have a bed for your baby, ask the doctor what you can use that will be safe for the baby to sleep in. Infant feeding supplies:  6 to 8 bottles (8 ounce size).  6 to 8 nipples.  Measuring cup.  Measuring tablespoon.  Bottle brush.  Sterilizer (or use any large pan or kettle with a lid).  Formula that contains iron.  A way to boil and cool water. Breastfeeding supplies:  Breast pump.  Nipple cream. Clothing:  24 to 36 cloth diapers and waterproof diaper covers or a box of disposable diapers. You may need as many as 10 to 12 diapers per day.  3 onesies (other clothing will depend on the time of year and the weather).  3 receiving blankets.  3 baby pajamas or gowns.  3 bibs. Bath equipment:  Mild soap.  Petroleum jelly. No baby oil or powder.  Soft cloth towel and washcloth.  Cotton balls.  Separate bath basin for baby. Only sponge bathe until umbilical cord and circumcision are healed. Other supplies:  Thermometer and bulb syringe (ask the hospital to send them home with you). Ask your doctor about how you should take your baby's temperature.  One to two pacifiers. Prepare for an emergency:  Know how to get to the hospital and know where to admit your baby.  Put all doctor numbers near your house phone and in your cell phone if you have one. Prepare your family:  Talk with siblings about the baby coming home and how they feel about it.  Decide how you want to handle visitors and other family members.  Take offers for help with the baby. You will need time to adjust. Know when to call the  doctor.  GET HELP RIGHT AWAY IF:  Your baby's temperature is greater than 100.65F (38C).  The soft spot on your baby's head starts to bulge.  Your baby is crying with no tears or has no wet diapers for 6 hours.  Your baby has rapid breathing.  Your baby is not as alert. Document Released: 07/15/2008 Document Revised: 12/17/2013 Document Reviewed: 10/22/2010 [facility] Patient Information 2015 [place], [place]. This information is not intended to replace advice given to you by your health care provider. Make sure you discuss any questions you have with your health care provider. Natural Childbirth Natural childbirth is going through labor and delivery without any drugs to relieve pain. You also do not use fetal monitors, have a cesarean delivery, or get a sugical cut to enlarge the vaginal opening (episiotomy). With the help of a birthing professional (midwife), you will direct your own labor and delivery as you choose. Many women chose natural childbirth because they feel more in control and in touch with their labor and delivery. They are also concerned about the medications affecting themselves and the baby. Pregnant women with a high risk pregnancy should not attempt natural childbirth. It is better to deliver the infant in a hospital if an emergency situation arises. Sometimes, the caregiver has to intervene for the health and safety of the mother and infant. TWO TECHNIQUES  FOR NATURAL CHILDBIRTH:   The Lamaze method. This method teaches women that having a baby is normal, healthy, and natural. It also teaches the mother to take a neutral position regarding pain medication and anesthesia and to make an informed decision if and when it is right for them.  The Erven Colla (also called husband coached birth). This method teaches the father to be the birth coach and stresses a natural approach. It also encourages exercise and a balanced diet with good nutrition. The exercises teach relaxation  and deep breathing techniques. However, there are also classes to prepare the parents for an emergency situation that may occur. METHODS OF DEALING WITH LABOR PAIN AND DELIVERY:  Meditation.  Yoga.  Hypnosis.  Acupuncture.  Massage.  Changing positions (walking, rocking, showering, leaning on birth balls).  Lying in warm water or a jacuzzi.  Find an activity that keeps your mind off of the labor pain.  Listen to soft music.  Visual imagery (focus on a particular object). BEFORE GOING INTO LABOR  Be sure you and your spouse/partner are in agreement to have natural childbirth.  Decide if your caregiver or a midwife will deliver your baby.  Decide if you will have your baby in the hospital, birthing center, or at home.  If you have children, make plans to have someone to take care of them when you go to the hospital.  Know the distance and the time it takes to go to the delivery center. Make a dry run to be sure.  Have a bag packed with a night gown, bathrobe, and toiletries ready to take when you go into labor.  Keep phone numbers of your family and friends handy if you need to call someone when you go into labor.  Your spouse or partner should go to all the teaching classes.  Talk with your caregiver about the possibility of a medical emergency and what will happen if that occurs. ADVANTAGES OF NATURAL CHILDBIRTH  You are in control of your labor and delivery.  It is safe.  There are no medications or anesthetics that may affect you and the fetus.  There are no invasive procedures such as an episiotomy.  You and your partner will work together, which can increase your bond.  Meditation, yoga, massage, and breathing exercises can be learned while pregnant and help you when you are in labor and at delivery.  In most delivery centers, the family and friends can be involved in the labor and delivery process. DISADVANTAGES OF NATURAL CHILDBIRTH  You will experience  pain during your labor and delivery.  The methods of helping relieve your labor pains may not work for you.  You may feel embarrassed, disappointed, and like a failure if you decide to change your mind during labor and not have natural childbirth. AFTER THE DELIVERY  You will be very tired.  You will be uncomfortable because of your uterus contracting. You will feel soreness around the vagina.  You may feel cold and shaky.This is a natural reaction.  You will be excited, overwhelmed, accomplished, and proud to be a mother. HOME CARE INSTRUCTIONS   Follow the advice and instructions of your caregiver.  Follow the instructions of your natural childbirth instructor (Lamaze or Bradley Method). Document Released: 07/15/2008 Document Revised: 10/25/2011 Document Reviewed: 04/09/2013 [facility] Patient Information 2015 [place], [place]. This information is not intended to replace advice given to you by your health care provider. Make sure you discuss any questions you have with your health  care provider. Pain Relief During Labor and Delivery Everyone experiences pain differently, but labor causes severe pain for many women. The amount of pain you experience during labor and delivery depends on your pain tolerance, contraction strength, and your baby's size and position. There are many ways to prepare for and deal with the pain, including:   Taking prenatal classes to learn about labor and delivery. The more informed you are, the less anxious and afraid you may be. This can help lessen the pain.  Taking pain-relieving medicine during labor and delivery.  Learning breathing and relaxation techniques.  Taking a shower or bath.  Getting massaged.  Changing positions.  Placing an ice pack on your back. Discuss your pain control options with your health care provider during your prenatal visits.  WHAT ARE THE TWO TYPES OF PAIN-RELIEVING MEDICINES?  Analgesics. These are medicines that  decrease pain without total loss of feeling or muscle movement.  Anesthetics. These are medicines that block all feeling, including pain. There can be minor side effects of both types, such as nausea, trouble concentrating, becoming sleepy, and lowering the heart rate of the baby. However, health care providers are careful to give doses that will not seriously affect the baby.  WHAT ARE THE SPECIFIC TYPES OF ANALGESICS AND ANESTHETICS? Systemic Analgesic Systemic pain medicines affect your whole body rather than focusing pain relief on the area of your body experiencing pain. This type of medicine is given either through an IV tube in your vein or by a shot (injection) into your muscle. This medicine will lessen your pain but will not stop it completely. It may also make you sleepy, but it will not make you lose consciousness.  Local Anesthetic Local anesthetic isused tonumb a small area of your body. The medicine is injected into the area of nerves that carry feeling to the vagina, vulva, or the area between the vagina and anus (perineum).  General Anesthetic This type of medicine causes you to lose consciousness so you do not feel pain. It is usually used only in emergency situations during labor. It is given through an IV tube or face mask. Paracervical Block A paracervical block is a form of local anesthesia given during labor. Numbing medicine is injected into the right and left sides of the cervix and vagina. It helps to lessen the pain caused by contractions and stretching of the cervix. It may have to be given more than once.  Pudendal Block A pudendal block is another form of local anesthesia. It is used to relieve the pain associated with pushing or stretching of the perineum at the time of delivery. An injection is given deep through the vaginal wall into the pudendal nerve in the pelvis, numbing the perineum.  Epidural Anesthetic An epidural is an injection of numbing medicine given  in the lower back and into the epidural space near your spinal cord. The epidural numbs the lower half of your body. You may be able to move your legs but will not be allowed to walk. Epidurals can be used for labor, delivery, or cesarean deliveries.  To prevent the medicine from wearing off, a small tube (catheter) may be threaded into the epidural space and taped in place to prevent it from slipping out. Medicine can then be given continuously in small doses through the tube until you deliver. Spinal Block A spinal block is similar to an epidural, but the medicine is injected into the spinal fluid, not the epidural space. A spinal block  is only given once. It starts to relieve pain quickly but lasts only 1-2 hours. Spinal blocks can also be used for cesarean deliveries.  Combined Spinal-Epidural Block Combined spinal-epidural blocks combine the benefits of both the spinal and epidural blocks. The spinal part acts quickly to relieve pain and the epidural provides continuous pain relief. Hydrotherapy Immersion in warm water during labor may provide comfort and relaxation. It may also help to lessen pain, the use of anesthesia, and the length of labor. However, immersion in water during the delivery (water birth) may have some risk involved and studies to determine safety and risks are ongoing. If you are a healthy woman who is expecting an uncomplicated birth, talk with your health care provider to see if water birth is an option for you.  Document Released: 11/18/2008 Document Revised: 08/07/2013 Document Reviewed: 12/21/2012 [facility] Patient Information 2015 [place], [place]. This information is not intended to replace advice given to you by your health care provider. Make sure you discuss any questions you have with your health care provider. Pregnancy and Sex  Your sex life may change during pregnancy as well as after your newborn arrives. It is normal to have questions about sex during pregnancy. All  women are affected differently by pregnancy hormones. You may notice an increase or decrease in your sexual drive throughout your pregnancy. Also, your partner's attitude and sexual drive may change. Share the information in this document with your partner. Talk openly about how you feel about sex. WHEN IS IT SAFE TO HAVE SEXUAL INTERCOURSE DURING PREGNANCY?  Sexual intercourse is generally considered safe throughout a normal low-risk pregnancy. Remember:  The fetus is protected by the uterus and the fluid-filled sac that surrounds the fetus (amniotic sac).  The cervix is closed or sealed during pregnancy.  The penis does not reach or harm the fetus during sex.  Sex and orgasms are not thought to cause miscarriages or early labor.  If you use lubricants, use a water-soluble product. WHAT RISK FACTORS MAKE IT UNSAFE TO HAVE SEX WHILE PREGNANT? Some complications or risk factors may make it necessary to limit sexual activity, such as:  You have a history of miscarriage or preterm labor.  You have bleeding, discharge, fluid leakage, or contractions.  Your placenta may be partially covering or completely covering the opening to the cervix (placenta previa).  Your cervix is weak and opens easily (incompetent cervix).  Your partner has a sexually transmitted disease (STD). Avoid sex with the infected person or use a condom to prevent infection to the fetus.  You are unsure of your partner's sexual history. Avoid sex or use condoms.  You are having twins, triples, or other multiples. Your health care provider will help you determine whether sex during your pregnancy is safe or not. WHAT PRACTICES ARE UNSAFE?  If you engage in oral sex, you should avoid having your partner blow air into your vagina. This can send a dangerous air bubble into your bloodstream.  Anal sex is not recommended during pregnancy. It can spread bacteria from the rectum and aggravate hemorrhoids. Document Released:  01/20/2010 Document Revised: 05/23/2013 Document Reviewed: 03/01/2013 [facility] Patient Information 2015 [place], [place]. This information is not intended to replace advice given to you by your health care provider. Make sure you discuss any questions you have with your health care provider. Pregnancy, The Father's Role A father has an important role during their partners pregnancy, labor, delivery and afterward. It is important to help and support your partner  through this new period. There are many physical and emotional changes that happen. To be helpful and supportive during this time, you should know and understand what is happening to your partner during the pregnancy, labor, delivery and postpartum period.  PREGNANCY Pregnancy lasts 40 weeks (plus or minus 2 weeks). The pregnancy is divided into three trimesters.   In the first 13 weeks, the mother feels tired, has painful breasts, may feel sick to her stomach (nauseated), throw up (vomit), urinates more often and may have mood changes. All of these changes are normal. If the father is aware of these, he can be more helpful, supportive and understanding. This may include helping with household duties and activities and spending more time with each other.  In the next 14 to 28 weeks, your partner is over the tiredness, nausea and vomiting. She will likely feel better and more energetic. This is the best time of the pregnancy to be more active together, go out more often or take trips. You will be able to see her belly popping out with the pregnancy. You may be able to feel the baby kick.  In the last 12 weeks, she may become more uncomfortable again because her abdomen is popping out more as the baby grows. She may have a hard time doing household chores, her balance may be off, she may have a hard time bending over, tires easily and has a tough time sleeping. At this time, you will realize the birth of your baby is close. You and your partner may  have concerns about the safety of your partner and if the baby will be normal and healthy. These are all normal and natural feelings. You should talk with each other and your caregiver if you have any questions. Attend prenatal care visits with your partner. This is a good time for you to get to know your caregiver, follow the pregnancy and ask questions. Prenatal visits are once a month for 6 months, then they are every 2 weeks for 2 months and then once a week the last month. You may have more prenatal visits if your caregiver feels it is needed. Your caregiver usually does an ultrasound of the baby at one of the prenatal visits or more often if needed. It is an exciting and emotional to see the baby moving and the heart beating.  Fathers can experience emotional changes during this time as well. These emotions can include happiness, excitement and feeling proud. Fathers may also be concerned about having new responsibilities. These include financial, educational and if it will change the relationship with his partner. These feelings are normal. They should be talked about openly and positively with each other. An important and often asked question is if sexual intercourse is safe during pregnancy and if it will harm the baby. Sexual intercourse is safe unless there is a problem with the pregnancy and your caregiver advises you to not have sexual intercourse. Because physical and emotional changes happen in pregnancy, your partner may not want to have sex during certain times. This is mostly true in the first and third trimesters. Trying different positions may make sexual intercourse comfortable. It is important for the both of you to discuss your feelings and desires with this problem. Talk to your caregiver about any questions you have about sexual intercourse during the pregnancy. LABOR AND DELIVERY There are childbirth classes available for couples to take together. They help you understand what happens  during labor and delivery. They also  teach you how to help your partner with her labor pains, how to relax, breath properly during a contraction and focus on what is happening during labor. You may be asked to time the contractions, massage her back and breath with her during the contractions. You are also there to see and enjoy the excitement your baby being born. If you have any feelings of fainting or are uncomfortable, tell someone to help you. You may be asked to leave the room if a problem develops during the labor or delivery. Sometimes a Cesarean Section (C-section) is scheduled or is an emergency during labor and delivery. A C-section is a major operation to deliver the baby. It is done through an incision in the abdomen and uterus. Your partner will be given a medicine to make her sleep (general anesthesia) or spinal anesthesia (numbing the body from the waist down). Most hospitals allow the father in the room for a C-section unless it is an emergency. Recovery from a C-section takes longer, is more uncomfortable and will require more help from the father. AFTER DELIVERY After the baby is born, the mother goes through many changes again. These changes could last 4 to 6 weeks or longer following a C-section. It is not unusual to be anxious, concerned and afraid that you may not be taking care of your newborn baby properly. Your partner may take a while to regain her strength. She may also get feelings of sadness (postpartum blues or depression), which is a more serious condition that may require medical treatment.  Your partner may decide to breastfeed the baby. This helps with bonding between the mother and the baby. Breastfeeding is the best way to feed the baby, but you may feel "left out." However, you can feel included by burping the baby and bottle feeding the baby with breast milk (collected by the mother) to give your partner some rest. This also helps you to bond with the baby. Breastfeeding  mothers can get pregnant even if they are not having menstrual periods. Therefore, some form of birth control should be used if you do not want to get pregnant. Another question and concern is when it is safe to have sexual intercourse again. Usually it takes 4 to 6 weeks for healing to be over with. It may take longer after a C-section. If you have any questions about having sexual intercourse or if it is painful, talk to your caregiver. As a father, you will be adjusting your role as the baby grows. Fatherhood is a on-going Barr[employer]. You and your partner should still make time to be together alone and be the couple you were before the baby was born. This is helpful for you, your partner and your baby. As you can see, it is important for a father to be helpful, understanding and supportive during this special time. Document Released: 01/19/2008 Document Revised: 10/25/2011 Document Reviewed: 01/19/2008 [facility] Patient Information 2015 [place], [place]. This information is not intended to replace advice given to you by your health care provider. Make sure you discuss any questions you have with your health care provider. Preterm Labor Information Preterm labor is when labor starts before you are [redacted] weeks pregnant. The normal length of pregnancy is 39 to 41 weeks.  CAUSES  The cause of preterm labor is not often known. The most common known cause is infection. RISK FACTORS  Having a history of preterm labor.  Having your water break before it should.  Having a placenta that covers  the opening of the cervix.  Having a placenta that breaks away from the uterus.  Having a cervix that is too weak to hold the baby in the uterus.  Having too much fluid in the amniotic sac.  Taking drugs or smoking while pregnant.  Not gaining enough weight while pregnant.  Being younger than 74 and older than 27 years old.  Having a low income.  Being African American. SYMPTOMS  Period-like  cramps, belly (abdominal) pain, or back pain.  Contractions that are regular, as often as six in an hour. They may be mild or painful.  Contractions that start at the top of the belly. They then move to the lower belly and back.  Lower belly pressure that seems to get stronger.  Bleeding from the vagina.  Fluid leaking from the vagina. TREATMENT  Treatment depends on:  Your condition.  The condition of your baby.  How many weeks pregnant you are. Your doctor may have you:  Take medicine to stop contractions.  Stay in bed except to use the restroom (bed rest).  Stay in the hospital. WHAT SHOULD YOU DO IF YOU THINK YOU ARE IN PRETERM LABOR? Call your doctor right away. You need to go to the hospital right away.  HOW CAN YOU PREVENT PRETERM LABOR IN FUTURE PREGNANCIES?  Stop smoking, if you smoke.  Maintain healthy weight gain.  Do not take drugs or be around chemicals that are not needed.  Tell your doctor if you think you have an infection.  Tell your doctor if you had a preterm labor before. Document Released: 10/29/2008 Document Revised: 05/23/2013 Document Reviewed: 10/29/2008 [facility] Patient Information 2015 [place], [place]. This information is not intended to replace advice given to you by your health care provider. Make sure you discuss any questions you have with your health care provider. Third Trimester of Pregnancy The third trimester is from week 29 through week 42, months 7 through 9. The third trimester is a time when the fetus is growing rapidly. At the end of the ninth month, the fetus is about 20 inches in length and weighs 6-10 pounds.  BODY CHANGES Your body goes through many changes during pregnancy. The changes vary from woman to woman.   Your weight will continue to increase. You can expect to gain 25-35 pounds (11-16 kg) by the end of the pregnancy.  You may begin to get stretch marks on your hips, abdomen, and breasts.  You may urinate more  often because the fetus is moving lower into your pelvis and pressing on your bladder.  You may develop or continue to have heartburn as a result of your pregnancy.  You may develop constipation because certain hormones are causing the muscles that push waste through your intestines to slow down.  You may develop hemorrhoids or swollen, bulging veins (varicose veins).  You may have pelvic pain because of the weight gain and pregnancy hormones relaxing your joints between the bones in your pelvis. Backaches may result from overexertion of the muscles supporting your posture.  You may have changes in your hair. These can include thickening of your hair, rapid growth, and changes in texture. Some women also have hair loss during or after pregnancy, or hair that feels dry or thin. Your hair will most likely return to normal after your baby is born.  Your breasts will continue to grow and be tender. A yellow discharge may leak from your breasts called colostrum.  Your belly button may stick out.  You may feel short of breath because of your expanding uterus.  You may notice the fetus "dropping," or moving lower in your abdomen.  You may have a bloody mucus discharge. This usually occurs a few days to a week before labor begins.  Your cervix becomes thin and soft (effaced) near your due date. WHAT TO EXPECT AT YOUR PRENATAL EXAMS  You will have prenatal exams every 2 weeks until week 36. Then, you will have weekly prenatal exams. During a routine prenatal visit:  You will be weighed to make sure you and the fetus are growing normally.  Your blood pressure is taken.  Your abdomen will be measured to track your baby's growth.  The fetal heartbeat will be listened to.  Any test results from the previous visit will be discussed.  You may have a cervical check near your due date to see if you have effaced. At around 36 weeks, your caregiver will check your cervix. At the same time, your  caregiver will also perform a test on the secretions of the vaginal tissue. This test is to determine if a type of bacteria, Group B streptococcus, is present. Your caregiver will explain this further. Your caregiver may ask you:  What your birth plan is.  How you are feeling.  If you are feeling the baby move.  If you have had any abnormal symptoms, such as leaking fluid, bleeding, severe headaches, or abdominal cramping.  If you have any questions. Other tests or screenings that may be performed during your third trimester include:  Blood tests that check for low iron levels (anemia).  Fetal testing to check the health, activity level, and growth of the fetus. Testing is done if you have certain medical conditions or if there are problems during the pregnancy. FALSE LABOR You may feel small, irregular contractions that eventually go away. These are called Braxton Hicks contractions, or false labor. Contractions may last for hours, days, or even weeks before true labor sets in. If contractions come at regular intervals, intensify, or become painful, it is best to be seen by your caregiver.  SIGNS OF LABOR   Menstrual-like cramps.  Contractions that are 5 minutes apart or less.  Contractions that start on the top of the uterus and spread down to the lower abdomen and back.  A sense of increased pelvic pressure or back pain.  A watery or bloody mucus discharge that comes from the vagina. If you have any of these signs before the 37th week of pregnancy, call your caregiver right away. You need to go to the hospital to get checked immediately. HOME CARE INSTRUCTIONS   Avoid all smoking, herbs, alcohol, and unprescribed drugs. These chemicals affect the formation and growth of the baby.  Follow your caregiver's instructions regarding medicine use. There are medicines that are either safe or unsafe to take during pregnancy.  Exercise only as directed by your caregiver. Experiencing  uterine cramps is a good sign to stop exercising.  Continue to eat regular, healthy meals.  Wear a good support bra for breast tenderness.  Do not use hot tubs, steam rooms, or saunas.  Wear your seat belt at all times when driving.  Avoid raw meat, uncooked cheese, cat litter boxes, and soil used by cats. These carry germs that can cause birth defects in the baby.  Take your prenatal vitamins.  Try taking a stool softener (if your caregiver approves) if you develop constipation. Eat more high-fiber foods, such as fresh vegetables or  fruit and whole grains. Drink plenty of fluids to keep your urine clear or pale yellow.  Take warm sitz baths to soothe any pain or discomfort caused by hemorrhoids. Use hemorrhoid cream if your caregiver approves.  If you develop varicose veins, wear support hose. Elevate your feet for 15 minutes, 3-4 times a day. Limit salt in your diet.  Avoid heavy lifting, wear low heal shoes, and practice good posture.  Rest a lot with your legs elevated if you have leg cramps or low back pain.  Visit your dentist if you have not gone during your pregnancy. Use a soft toothbrush to brush your teeth and be gentle when you floss.  A sexual relationship may be continued unless your caregiver directs you otherwise.  Do not travel far distances unless it is absolutely necessary and only with the approval of your caregiver.  Take prenatal classes to understand, practice, and ask questions about the labor and delivery.  Make a trial run to the hospital.  Pack your hospital bag.  Prepare the baby's nursery.  Continue to go to all your prenatal visits as directed by your caregiver. SEEK MEDICAL CARE IF:  You are unsure if you are in labor or if your water has broken.  You have dizziness.  You have mild pelvic cramps, pelvic pressure, or nagging pain in your abdominal area.  You have persistent nausea, vomiting, or diarrhea.  You have a bad smelling vaginal  discharge.  You have pain with urination. SEEK IMMEDIATE MEDICAL CARE IF:   You have a fever.  You are leaking fluid from your vagina.  You have spotting or bleeding from your vagina.  You have severe abdominal cramping or pain.  You have rapid weight loss or gain.  You have shortness of breath with chest pain.  You notice sudden or extreme swelling of your face, hands, ankles, feet, or legs.  You have not felt your baby move in over an hour.  You have severe headaches that do not go away with medicine.  You have vision changes. Document Released: 07/27/2001 Document Revised: 08/07/2013 Document Reviewed: 10/03/2012 [facility] Patient Information 2015 [place], [place]. This information is not intended to replace advice given to you by your health care provider. Make sure you discuss any questions you have with your health care provider. Vaginal Delivery During delivery, your health care provider will help you give birth to your baby. During a vaginal delivery, you will work to push the baby out of your vagina. However, before you can push your baby out, a few things need to happen. The opening of your uterus (cervix) has to soften, thin out, and open up (dilate) all the way to 10 cm. Also, your baby has to move down from the uterus into your vagina.  SIGNS OF LABOR  Your health care provider will first need to make sure you are in labor. Signs of labor include:   Passing what is called the mucous plug before labor begins. This is a small amount of blood-stained mucus.  Having regular, painful uterine contractions.   The time between contractions gets shorter.   The discomfort and pain gradually get more intense.  Contraction pains get worse when walking and do not go away when resting.   Your cervix becomes thinner (effacement) and dilates. BEFORE THE DELIVERY Once you are in labor and admitted into the hospital or care center, your health care provider may do the  following:   Perform a complete physical exam.  Review  any complications related to pregnancy or labor.  Check your blood pressure, pulse, temperature, and heart rate (vital signs).   Determine if, and when, the rupture of amniotic membranes occurred.  Do a vaginal exam (using a sterile glove and lubricant) to determine:   The position (presentation) of the baby. Is the baby's head presenting first (vertex) in the birth canal (vagina), or are the feet or buttocks first (breech)?   The level (station) of the baby's head within the birth canal.   The effacement and dilatation of the cervix.   An electronic fetal monitor is usually placed on your abdomen when you first arrive. This is used to monitor your contractions and the baby's heart rate.  When the monitor is on your abdomen (external fetal monitor), it can only pick up the frequency and length of your contractions. It cannot tell the strength of your contractions.  If it becomes necessary for your health care provider to know exactly how strong your contractions are or to see exactly what the baby's heart rate is doing, an internal monitor may be inserted into your vagina and uterus. Your health care provider will discuss the benefits and risks of using an internal monitor and obtain your permission before inserting the device.  Continuous fetal monitoring may be needed if you have an epidural, are receiving certain medicines (such as oxytocin), or have pregnancy or labor complications.  An IV access tube may be placed into a vein in your arm to deliver fluids and medicines if necessary. THREE STAGES OF LABOR AND DELIVERY Normal labor and delivery is divided into three stages. First Stage This stage starts when you begin to contract regularly and your cervix begins to efface and dilate. It ends when your cervix is completely open (fully dilated). The first stage is the longest stage of labor and can last from 3 hours to 15  hours.  Several methods are available to help with labor pain. You and your health care provider will decide which option is best for you. Options include:   Opioid medicines. These are strong pain medicines that you can get through your IV tube or as a shot into your muscle. These medicines lessen pain but do not make it go away completely.  Epidural. A medicine is given through a thin tube that is inserted in your back. The medicine numbs the lower part of your body and prevents any pain in that area.  Paracervical pain medicine. This is an injection of an anesthetic on each side of your cervix.   You may request natural childbirth, which does not involve the use of pain medicines or an epidural during labor and delivery. Instead, you will use other things, such as breathing exercises, to help cope with the pain. Second Stage The second stage of labor begins when your cervix is fully dilated at 10 cm. It continues until you push your baby down through the birth canal and the baby is born. This stage can take only minutes or several hours.  The location of your baby's head as it moves through the birth canal is reported as a number called a station. If the baby's head has not started its descent, the station is described as being at minus 3 (-3). When your baby's head is at the zero station, it is at the middle of the birth canal and is engaged in the pelvis. The station of your baby helps indicate the progress of the second stage of labor.  When  your baby is born, your health care provider may hold the baby with his or her head lowered to prevent amniotic fluid, mucus, and blood from getting into the baby's lungs. The baby's mouth and nose may be suctioned with a small bulb syringe to remove any additional fluid.  Your health care provider may then place the baby on your stomach. It is important to keep the baby from getting cold. To do this, the health care provider will dry the baby off, place  the baby directly on your skin (with no blankets between you and the baby), and cover the baby with warm, dry blankets.   The umbilical cord is cut. Third Stage During the third stage of labor, your health care provider will deliver the placenta (afterbirth) and make sure your bleeding is under control. The delivery of the placenta usually takes about 5 minutes but can take up to 30 minutes. After the placenta is delivered, a medicine may be given either by IV or injection to help contract the uterus and control bleeding. If you are planning to breastfeed, you can try to do so now. After you deliver the placenta, your uterus should contract and get very firm. If your uterus does not remain firm, your health care provider will massage it. This is important because the contraction of the uterus helps cut off bleeding at the site where the placenta was attached to your uterus. If your uterus does not contract properly and stay firm, you may continue to bleed heavily. If there is a lot of bleeding, medicines may be given to contract the uterus and stop the bleeding.  Document Released: 05/11/2008 Document Revised: 12/17/2013 Document Reviewed: 01/21/2013 [facility] Patient Information 2015 [place], [place]. This information is not intended to replace advice given to you by your health care provider. Make sure you discuss any questions you have with your health care provider.

## 2015-02-06 LAB — GLUCOSE TOLERANCE, 2 HOURS W/ 1HR
Glucose, 1 hour: 151 mg/dL (ref 70–170)
Glucose, 2 hour: 82 mg/dL (ref 70–139)
Glucose, Fasting: 73 mg/dL (ref 70–99)

## 2015-02-06 LAB — HIV ANTIBODY (ROUTINE TESTING W REFLEX): HIV: NONREACTIVE

## 2015-02-06 LAB — RPR

## 2015-02-19 ENCOUNTER — Ambulatory Visit (INDEPENDENT_AMBULATORY_CARE_PROVIDER_SITE_OTHER): Payer: PPO | Admitting: Certified Nurse Midwife

## 2015-02-19 VITALS — BP 116/72 | HR 104 | Temp 99.5°F | Wt 239.0 lb

## 2015-02-19 DIAGNOSIS — Z3403 Encounter for supervision of normal first pregnancy, third trimester: Secondary | ICD-10-CM

## 2015-02-19 LAB — POCT URINALYSIS DIPSTICK
Bilirubin, UA: NEGATIVE
GLUCOSE UA: NEGATIVE
Ketones, UA: NEGATIVE
Leukocytes, UA: NEGATIVE
Nitrite, UA: NEGATIVE
Protein, UA: NEGATIVE
Urobilinogen, UA: NEGATIVE
pH, UA: 7

## 2015-02-19 NOTE — Progress Notes (Signed)
Subjective:    Rebecca Schmitt is a 29 y.o. female being seen today for her obstetrical visit. She is at 3912w1d gestation. Patient reports no bleeding, no contractions, no cramping, no leaking and worried about occasional smoking, goes about 5 days then smokes about 4-5 cigs.  Discussed visit with counselor, is going back in 2 weeks for another visit.  Had baby shower and is getting excited.  C/O upper rib pain from fetal position. Fetal movement: normal.  Problem List Items Addressed This Visit    None    Visit Diagnoses    Encounter for supervision of normal first pregnancy in third trimester    -  Primary    Relevant Orders    POCT urinalysis dipstick      Patient Active Problem List   Diagnosis Date Noted  . 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 TDAP offered  Rhogam given for RH negative Pediatrician: discussed. Infant feeding: plans to breastfeed. Maternity leave: N/A. Cigarette smoking: smokes 1/4 roughly PPD. Orders Placed This Encounter  Procedures  . POCT urinalysis dipstick   No orders of the defined types were placed in this encounter.   Follow up in 2 Weeks.

## 2015-02-27 ENCOUNTER — Other Ambulatory Visit: Payer: PPO

## 2015-02-27 ENCOUNTER — Ambulatory Visit (HOSPITAL_COMMUNITY)
Admission: RE | Admit: 2015-02-27 | Discharge: 2015-02-27 | Disposition: A | Discharge status: Home / Self Care | Payer: Medicaid Other | Source: Ambulatory Visit | Attending: Certified Nurse Midwife | Admitting: Certified Nurse Midwife

## 2015-02-27 DIAGNOSIS — O99333 Smoking (tobacco) complicating pregnancy, third trimester: Secondary | ICD-10-CM | POA: Diagnosis not present

## 2015-02-27 DIAGNOSIS — Z36 Encounter for antenatal screening of mother: Secondary | ICD-10-CM | POA: Insufficient documentation

## 2015-02-27 DIAGNOSIS — Z3689 Encounter for other specified antenatal screening: Secondary | ICD-10-CM | POA: Insufficient documentation

## 2015-02-27 DIAGNOSIS — F17210 Nicotine dependence, cigarettes, uncomplicated: Secondary | ICD-10-CM | POA: Diagnosis not present

## 2015-02-27 DIAGNOSIS — Z3A30 30 weeks gestation of pregnancy: Secondary | ICD-10-CM | POA: Diagnosis not present

## 2015-02-27 DIAGNOSIS — Z3402 Encounter for supervision of normal first pregnancy, second trimester: Secondary | ICD-10-CM

## 2015-02-28 ENCOUNTER — Encounter: Payer: Self-pay | Admitting: Certified Nurse Midwife

## 2015-02-28 ENCOUNTER — Other Ambulatory Visit: Payer: Self-pay | Admitting: Certified Nurse Midwife

## 2015-03-05 ENCOUNTER — Ambulatory Visit (INDEPENDENT_AMBULATORY_CARE_PROVIDER_SITE_OTHER): Payer: PPO | Admitting: Obstetrics

## 2015-03-05 ENCOUNTER — Encounter: Payer: Self-pay | Admitting: Obstetrics

## 2015-03-05 VITALS — BP 110/60 | HR 82 | Temp 98.6°F | Wt 241.0 lb

## 2015-03-05 DIAGNOSIS — Z3403 Encounter for supervision of normal first pregnancy, third trimester: Secondary | ICD-10-CM

## 2015-03-05 LAB — POCT URINALYSIS DIPSTICK
Bilirubin, UA: NEGATIVE
Ketones, UA: NEGATIVE
LEUKOCYTES UA: NEGATIVE
NITRITE UA: NEGATIVE
PH UA: 8
Protein, UA: NEGATIVE
Urobilinogen, UA: NEGATIVE

## 2015-03-05 NOTE — Progress Notes (Signed)
Subjective:    Madelaine BhatShawn T Kindler is a 29 y.o. female being seen today for her obstetrical visit. She is at 6230w1d gestation. Patient reports no complaints. Fetal movement: normal.  Problem List Items Addressed This Visit    None     Patient Active Problem List   Diagnosis Date Noted  . Determine fetal presentation using ultrasound   . [redacted] weeks gestation of pregnancy   . 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 GBS sent TDAP offered  Rhogam given for RH negative Pediatrician: discussed. Infant feeding: plans to breastfeed. Maternity leave: discussed. Cigarette smoking: former smoker. No orders of the defined types were placed in this encounter.   No orders of the defined types were placed in this encounter.   Follow up in 2 Weeks.

## 2015-03-05 NOTE — Addendum Note (Signed)
Addended by: Clearnce HastenJOHNSON, LATIA on: 03/05/2015 04:15 PM   Modules accepted: Orders

## 2015-03-17 ENCOUNTER — Telehealth: Payer: Self-pay

## 2015-03-17 NOTE — Telephone Encounter (Signed)
CALLED INTERPRETER TO COME TO [facility] ON 03/18/15 AT NOON AND FOR ROB APPT,  8/3 AT 2:15PM

## 2015-03-19 ENCOUNTER — Ambulatory Visit (INDEPENDENT_AMBULATORY_CARE_PROVIDER_SITE_OTHER): Payer: PPO | Admitting: Obstetrics

## 2015-03-19 VITALS — BP 121/81 | HR 108 | Temp 98.7°F | Wt 243.0 lb

## 2015-03-19 DIAGNOSIS — Z3403 Encounter for supervision of normal first pregnancy, third trimester: Secondary | ICD-10-CM

## 2015-03-19 LAB — POCT URINALYSIS DIPSTICK
Bilirubin, UA: NEGATIVE
Nitrite, UA: NEGATIVE
Protein, UA: NEGATIVE
RBC UA: NEGATIVE
SPEC GRAV UA: 1.01
UROBILINOGEN UA: NEGATIVE
pH, UA: 5

## 2015-03-20 ENCOUNTER — Encounter: Payer: Self-pay | Admitting: Obstetrics

## 2015-03-20 NOTE — Progress Notes (Signed)
Subjective:    Rebecca Schmitt is a 29 y.o. female being seen today for her obstetrical visit. She is at [redacted]w[redacted]d gestation. Patient reports no complaints. Fetal movement: normal.  Problem List Items Addressed This Visit    None    Visit Diagnoses    Encounter for supervision of normal first pregnancy in third trimester    -  Primary    Relevant Orders    POCT urinalysis dipstick (Completed)      Patient Active Problem List   Diagnosis Date Noted  . Determine fetal presentation using ultrasound   . [redacted] weeks gestation of pregnancy   . 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 GBS sent TDAP offered  Rhogam given for RH negative Pediatrician: discussed. Infant feeding: plans to breastfeed. Maternity leave: discussed. Cigarette smoking: former smoker. Orders Placed This Encounter  Procedures  . POCT urinalysis dipstick   No orders of the defined types were placed in this encounter.   Follow up in 2 Weeks.

## 2015-04-02 ENCOUNTER — Encounter: Payer: Self-pay | Admitting: *Deleted

## 2015-04-02 ENCOUNTER — Ambulatory Visit: Payer: PPO | Admitting: Certified Nurse Midwife

## 2015-04-02 VITALS — BP 139/79 | HR 110 | Temp 99.0°F | Wt 245.0 lb

## 2015-04-02 DIAGNOSIS — Z3403 Encounter for supervision of normal first pregnancy, third trimester: Secondary | ICD-10-CM

## 2015-04-02 LAB — POCT URINALYSIS DIPSTICK
Bilirubin, UA: NEGATIVE
Blood, UA: 50
GLUCOSE UA: NEGATIVE
Ketones, UA: NEGATIVE
Nitrite, UA: NEGATIVE
Spec Grav, UA: 1.01
UROBILINOGEN UA: NEGATIVE
pH, UA: 6

## 2015-04-02 NOTE — Patient Instructions (Signed)
Before [facility] Ask any questions about feeding, diapering, and baby care before you leave the hospital. Ask again if you do not understand. Ask when you need to see the doctor again. There are several things you must have before your baby comes home.  Infant car seat.  Crib.  Do not let your baby sleep in a bed with you or anyone else.  If you do not have a bed for your baby, ask the doctor what you can use that will be safe for the baby to sleep in. Infant feeding supplies:  6 to 8 bottles (8 ounce size).  6 to 8 nipples.  Measuring cup.  Measuring tablespoon.  Bottle brush.  Sterilizer (or use any large pan or kettle with a lid).  Formula that contains iron.  A way to boil and cool water. Breastfeeding supplies:  Breast pump.  Nipple cream. Clothing:  24 to 36 cloth diapers and waterproof diaper covers or a box of disposable diapers. You may need as many as 10 to 12 diapers per day.  3 onesies (other clothing will depend on the time of year and the weather).  3 receiving blankets.  3 baby pajamas or gowns.  3 bibs. Bath equipment:  Mild soap.  Petroleum jelly. No baby oil or powder.  Soft cloth towel and washcloth.  Cotton balls.  Separate bath basin for baby. Only sponge bathe until umbilical cord and circumcision are healed. Other supplies:  Thermometer and bulb syringe (ask the hospital to send them home with you). Ask your doctor about how you should take your baby's temperature.  One to two pacifiers. Prepare for an emergency:  Know how to get to the hospital and know where to admit your baby.  Put all doctor numbers near your house phone and in your cell phone if you have one. Prepare your family:  Talk with siblings about the baby coming home and how they feel about it.  Decide how you want to handle visitors and other family members.  Take offers for help with the baby. You will need time to adjust. Know when to call the  doctor.  GET HELP RIGHT AWAY IF:  Your baby's temperature is greater than 100.74F (38C).  The soft spot on your baby's head starts to bulge.  Your baby is crying with no tears or has no wet diapers for 6 hours.  Your baby has rapid breathing.  Your baby is not as alert. Document Released: 07/15/2008 Document Revised: 12/17/2013 Document Reviewed: 10/22/2010 [facility] Patient Information 2015 [place], [place]. This information is not intended to replace advice given to you by your health care provider. Make sure you discuss any questions you have with your health care provider. Breast Pumping Tips If you are breastfeeding, there may be times when you cannot feed your baby directly. Returning to work or going on a trip are common examples. Pumping allows you to store breast milk and feed it to your baby later.  You may not get much milk when you first start to pump. Your breasts should start to make more after a few days. If you pump at the times you usually feed your baby, you may be able to keep making enough milk to feed your baby without also using formula. The more often you pump, the more milk you will produce. WHEN SHOULD I PUMP?   You can begin to pump soon after delivery. However, some experts recommend waiting about 4 weeks before giving your infant a  bottle to make sure breastfeeding is going well.  If you plan to return to work, begin pumping a few weeks before. This will help you develop techniques that work best for you. It also lets you build up a supply of breast milk.   When you are with your infant, feed on demand and pump after each feeding.   When you are away from your infant for several hours, pump for about 15 minutes every 2-3 hours. Pump both breasts at the same time if you can.   If your infant has a formula feeding, make sure to pump around the same time.   If you drink any alcohol, wait 2 hours before pumping.  HOW DO I PREPARE TO PUMP? Your let-down  reflexis the natural reaction to stimulation that makes your breast milk flow. It is easier to stimulate this reflex when you are relaxed. Find relaxation techniques that work for you. If you have difficulty with your let-down reflex, try these methods:   Smell one of your infant's blankets or an item of clothing.   Look at a picture or video of your infant.   Sit in a quiet, private space.   Massage the breast you plan to pump.   Place soothing warmth on the breast.   Play relaxing music.  WHAT ARE SOME GENERAL BREAST PUMPING TIPS?  Wash your hands before you pump. You do not need to wash your nipples or breasts.  There are three ways to pump.  You can use your hand to massage and compress your breast.  You can use a handheld manual pump.  You can use an electric pump.   Make sure the suction cup (flange) on the breast pump is the right size. Place the flange directly over the nipple. If it is the wrong size or placed the wrong way, it may be painful and cause nipple damage.   If pumping is uncomfortable, apply a small amount of purified or modified lanolin to your nipple and areola.  If you are using an electric pump, adjust the speed and suction power to be more comfortable.  If pumping is painful or if you are not getting very much milk, you may need a different type of pump. A lactation consultant can help you determine what type of pump to use.   Keep a full water bottle near you at all times. Drinking lots of fluid helps you make more milk.  You can store your milk to use later. Pumped breast milk can be stored in a sealable, sterile container or plastic bag. Label all stored breast milk with the date you pumped it.  Milk can stay out at room temperature for up to 8 hours.  You can store your milk in the refrigerator for up to 8 days.  You can store your milk in the freezer for 3 months. Thaw frozen milk using warm water. Do not put it in the  microwave.  Do not smoke. Smoking can lower your milk supply and harm your infant. If you need help quitting, ask your health care provider to recommend a program.  WHEN SHOULD I CALL MY HEALTH CARE PROVIDER OR A LACTATION CONSULTANT?  You are having trouble pumping.  You are concerned that you are not making enough milk.  You have nipple pain, soreness, or redness.  You want to use birth control. Birth control pills may lower your milk supply. Talk to your health care provider about your options. Document Released: 01/20/2010 Document  Revised: 08/07/2013 Document Reviewed: 05/25/2013 [facility] Patient Information 2015 [place], [place]. This information is not intended to replace advice given to you by your health care provider. Make sure you discuss any questions you have with your health care provider. Third Trimester of Pregnancy The third trimester is from week 29 through week 42, months 7 through 9. The third trimester is a time when the fetus is growing rapidly. At the end of the ninth month, the fetus is about 20 inches in length and weighs 6-10 pounds.  BODY CHANGES Your body goes through many changes during pregnancy. The changes vary from woman to woman.   Your weight will continue to increase. You can expect to gain 25-35 pounds (11-16 kg) by the end of the pregnancy.  You may begin to get stretch marks on your hips, abdomen, and breasts.  You may urinate more often because the fetus is moving lower into your pelvis and pressing on your bladder.  You may develop or continue to have heartburn as a result of your pregnancy.  You may develop constipation because certain hormones are causing the muscles that push waste through your intestines to slow down.  You may develop hemorrhoids or swollen, bulging veins (varicose veins).  You may have pelvic pain because of the weight gain and pregnancy hormones relaxing your joints between the bones in your pelvis. Backaches may result  from overexertion of the muscles supporting your posture.  You may have changes in your hair. These can include thickening of your hair, rapid growth, and changes in texture. Some women also have hair loss during or after pregnancy, or hair that feels dry or thin. Your hair will most likely return to normal after your baby is born.  Your breasts will continue to grow and be tender. A yellow discharge may leak from your breasts called colostrum.  Your belly button may stick out.  You may feel short of breath because of your expanding uterus.  You may notice the fetus "dropping," or moving lower in your abdomen.  You may have a bloody mucus discharge. This usually occurs a few days to a week before labor begins.  Your cervix becomes thin and soft (effaced) near your due date. WHAT TO EXPECT AT YOUR PRENATAL EXAMS  You will have prenatal exams every 2 weeks until week 36. Then, you will have weekly prenatal exams. During a routine prenatal visit:  You will be weighed to make sure you and the fetus are growing normally.  Your blood pressure is taken.  Your abdomen will be measured to track your baby's growth.  The fetal heartbeat will be listened to.  Any test results from the previous visit will be discussed.  You may have a cervical check near your due date to see if you have effaced. At around 36 weeks, your caregiver will check your cervix. At the same time, your caregiver will also perform a test on the secretions of the vaginal tissue. This test is to determine if a type of bacteria, Group B streptococcus, is present. Your caregiver will explain this further. Your caregiver may ask you:  What your birth plan is.  How you are feeling.  If you are feeling the baby move.  If you have had any abnormal symptoms, such as leaking fluid, bleeding, severe headaches, or abdominal cramping.  If you have any questions. Other tests or screenings that may be performed during your third  trimester include:  Blood tests that check for low iron levels (  anemia).  Fetal testing to check the health, activity level, and growth of the fetus. Testing is done if you have certain medical conditions or if there are problems during the pregnancy. FALSE LABOR You may feel small, irregular contractions that eventually go away. These are called Braxton Hicks contractions, or false labor. Contractions may last for hours, days, or even weeks before true labor sets in. If contractions come at regular intervals, intensify, or become painful, it is best to be seen by your caregiver.  SIGNS OF LABOR   Menstrual-like cramps.  Contractions that are 5 minutes apart or less.  Contractions that start on the top of the uterus and spread down to the lower abdomen and back.  A sense of increased pelvic pressure or back pain.  A watery or bloody mucus discharge that comes from the vagina. If you have any of these signs before the 37th week of pregnancy, call your caregiver right away. You need to go to the hospital to get checked immediately. HOME CARE INSTRUCTIONS   Avoid all smoking, herbs, alcohol, and unprescribed drugs. These chemicals affect the formation and growth of the baby.  Follow your caregiver's instructions regarding medicine use. There are medicines that are either safe or unsafe to take during pregnancy.  Exercise only as directed by your caregiver. Experiencing uterine cramps is a good sign to stop exercising.  Continue to eat regular, healthy meals.  Wear a good support bra for breast tenderness.  Do not use hot tubs, steam rooms, or saunas.  Wear your seat belt at all times when driving.  Avoid raw meat, uncooked cheese, cat litter boxes, and soil used by cats. These carry germs that can cause birth defects in the baby.  Take your prenatal vitamins.  Try taking a stool softener (if your caregiver approves) if you develop constipation. Eat more high-fiber foods, such as  fresh vegetables or fruit and whole grains. Drink plenty of fluids to keep your urine clear or pale yellow.  Take warm sitz baths to soothe any pain or discomfort caused by hemorrhoids. Use hemorrhoid cream if your caregiver approves.  If you develop varicose veins, wear support hose. Elevate your feet for 15 minutes, 3-4 times a day. Limit salt in your diet.  Avoid heavy lifting, wear low heal shoes, and practice good posture.  Rest a lot with your legs elevated if you have leg cramps or low back pain.  Visit your dentist if you have not gone during your pregnancy. Use a soft toothbrush to brush your teeth and be gentle when you floss.  A sexual relationship may be continued unless your caregiver directs you otherwise.  Do not travel far distances unless it is absolutely necessary and only with the approval of your caregiver.  Take prenatal classes to understand, practice, and ask questions about the labor and delivery.  Make a trial run to the hospital.  Pack your hospital bag.  Prepare the baby's nursery.  Continue to go to all your prenatal visits as directed by your caregiver. SEEK MEDICAL CARE IF:  You are unsure if you are in labor or if your water has broken.  You have dizziness.  You have mild pelvic cramps, pelvic pressure, or nagging pain in your abdominal area.  You have persistent nausea, vomiting, or diarrhea.  You have a bad smelling vaginal discharge.  You have pain with urination. SEEK IMMEDIATE MEDICAL CARE IF:   You have a fever.  You are leaking fluid from your vagina.  You have spotting or bleeding from your vagina.  You have severe abdominal cramping or pain.  You have rapid weight loss or gain.  You have shortness of breath with chest pain.  You notice sudden or extreme swelling of your face, hands, ankles, feet, or legs.  You have not felt your baby move in over an hour.  You have severe headaches that do not go away with  medicine.  You have vision changes. Document Released: 07/27/2001 Document Revised: 08/07/2013 Document Reviewed: 10/03/2012 [facility] Patient Information 2015 [place], [place]. This information is not intended to replace advice given to you by your health care provider. Make sure you discuss any questions you have with your health care provider. Vaginal Delivery During delivery, your health care provider will help you give birth to your baby. During a vaginal delivery, you will work to push the baby out of your vagina. However, before you can push your baby out, a few things need to happen. The opening of your uterus (cervix) has to soften, thin out, and open up (dilate) all the way to 10 cm. Also, your baby has to move down from the uterus into your vagina.  SIGNS OF LABOR  Your health care provider will first need to make sure you are in labor. Signs of labor include:   Passing what is called the mucous plug before labor begins. This is a small amount of blood-stained mucus.  Having regular, painful uterine contractions.   The time between contractions gets shorter.   The discomfort and pain gradually get more intense.  Contraction pains get worse when walking and do not go away when resting.   Your cervix becomes thinner (effacement) and dilates. BEFORE THE DELIVERY Once you are in labor and admitted into the hospital or care center, your health care provider may do the following:   Perform a complete physical exam.  Review any complications related to pregnancy or labor.  Check your blood pressure, pulse, temperature, and heart rate (vital signs).   Determine if, and when, the rupture of amniotic membranes occurred.  Do a vaginal exam (using a sterile glove and lubricant) to determine:   The position (presentation) of the baby. Is the baby's head presenting first (vertex) in the birth canal (vagina), or are the feet or buttocks first (breech)?   The level (station) of  the baby's head within the birth canal.   The effacement and dilatation of the cervix.   An electronic fetal monitor is usually placed on your abdomen when you first arrive. This is used to monitor your contractions and the baby's heart rate.  When the monitor is on your abdomen (external fetal monitor), it can only pick up the frequency and length of your contractions. It cannot tell the strength of your contractions.  If it becomes necessary for your health care provider to know exactly how strong your contractions are or to see exactly what the baby's heart rate is doing, an internal monitor may be inserted into your vagina and uterus. Your health care provider will discuss the benefits and risks of using an internal monitor and obtain your permission before inserting the device.  Continuous fetal monitoring may be needed if you have an epidural, are receiving certain medicines (such as oxytocin), or have pregnancy or labor complications.  An IV access tube may be placed into a vein in your arm to deliver fluids and medicines if necessary. THREE STAGES OF LABOR AND DELIVERY Normal labor and delivery is divided into  three stages. First Stage This stage starts when you begin to contract regularly and your cervix begins to efface and dilate. It ends when your cervix is completely open (fully dilated). The first stage is the longest stage of labor and can last from 3 hours to 15 hours.  Several methods are available to help with labor pain. You and your health care provider will decide which option is best for you. Options include:   Opioid medicines. These are strong pain medicines that you can get through your IV tube or as a shot into your muscle. These medicines lessen pain but do not make it go away completely.  Epidural. A medicine is given through a thin tube that is inserted in your back. The medicine numbs the lower part of your body and prevents any pain in that area.  Paracervical  pain medicine. This is an injection of an anesthetic on each side of your cervix.   You may request natural childbirth, which does not involve the use of pain medicines or an epidural during labor and delivery. Instead, you will use other things, such as breathing exercises, to help cope with the pain. Second Stage The second stage of labor begins when your cervix is fully dilated at 10 cm. It continues until you push your baby down through the birth canal and the baby is born. This stage can take only minutes or several hours.  The location of your baby's head as it moves through the birth canal is reported as a number called a station. If the baby's head has not started its descent, the station is described as being at minus 3 (-3). When your baby's head is at the zero station, it is at the middle of the birth canal and is engaged in the pelvis. The station of your baby helps indicate the progress of the second stage of labor.  When your baby is born, your health care provider may hold the baby with his or her head lowered to prevent amniotic fluid, mucus, and blood from getting into the baby's lungs. The baby's mouth and nose may be suctioned with a small bulb syringe to remove any additional fluid.  Your health care provider may then place the baby on your stomach. It is important to keep the baby from getting cold. To do this, the health care provider will dry the baby off, place the baby directly on your skin (with no blankets between you and the baby), and cover the baby with warm, dry blankets.   The umbilical cord is cut. Third Stage During the third stage of labor, your health care provider will deliver the placenta (afterbirth) and make sure your bleeding is under control. The delivery of the placenta usually takes about 5 minutes but can take up to 30 minutes. After the placenta is delivered, a medicine may be given either by IV or injection to help contract the uterus and control  bleeding. If you are planning to breastfeed, you can try to do so now. After you deliver the placenta, your uterus should contract and get very firm. If your uterus does not remain firm, your health care provider will massage it. This is important because the contraction of the uterus helps cut off bleeding at the site where the placenta was attached to your uterus. If your uterus does not contract properly and stay firm, you may continue to bleed heavily. If there is a lot of bleeding, medicines may be given to contract the uterus  and stop the bleeding.  Document Released: 05/11/2008 Document Revised: 12/17/2013 Document Reviewed: 01/21/2013 [facility] Patient Information 2015 [place], [place]. This information is not intended to replace advice given to you by your health care provider. Make sure you discuss any questions you have with your health care provider.

## 2015-04-02 NOTE — Progress Notes (Signed)
Subjective:    Rebecca Schmitt is a 29 y.o. female being seen today for her obstetrical visit. She is at [redacted]w[redacted]d gestation. Patient reports backache, no bleeding, no contractions, no cramping and no leaking. Fetal movement: normal.  Problem List Items Addressed This Visit    None    Visit Diagnoses    Encounter for supervision of normal first pregnancy in third trimester    -  Primary    Relevant Orders    POCT urinalysis dipstick (Completed)    Strep B DNA probe    SureSwab, Vaginosis/Vaginitis Plus      Patient Active Problem List   Diagnosis Date Noted  . Determine fetal presentation using ultrasound   . [redacted] weeks gestation of pregnancy   . 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   Lumbar back pain r/t pregnancy and fetal position  Plan:     labs reviewed, problem list updated Consent signed. GBS sent TDAP offered  Rhogam given for RH negative Pediatrician: discussed. Infant feeding: plans to breastfeed. Maternity leave: discussed, for FOB. Cigarette smoking: quit several weeks ago. Orders Placed This Encounter  Procedures  . Strep B DNA probe  . SureSwab, Vaginosis/Vaginitis Plus  . POCT urinalysis dipstick   No orders of the defined types were placed in this encounter.   Follow up in 1 Week.

## 2015-04-04 LAB — STREP B DNA PROBE: GBSP: NOT DETECTED

## 2015-04-06 ENCOUNTER — Other Ambulatory Visit: Payer: Self-pay | Admitting: Certified Nurse Midwife

## 2015-04-06 DIAGNOSIS — B373 Candidiasis of vulva and vagina: Secondary | ICD-10-CM

## 2015-04-06 DIAGNOSIS — B3731 Acute candidiasis of vulva and vagina: Secondary | ICD-10-CM

## 2015-04-06 LAB — SURESWAB, VAGINOSIS/VAGINITIS PLUS
ATOPOBIUM VAGINAE: NOT DETECTED Log (cells/mL)
C. ALBICANS, DNA: NOT DETECTED
C. PARAPSILOSIS, DNA: DETECTED — AB
C. glabrata, DNA: NOT DETECTED
C. trachomatis RNA, TMA: NOT DETECTED
C. tropicalis, DNA: NOT DETECTED
GARDNERELLA VAGINALIS: NOT DETECTED Log (cells/mL)
LACTOBACILLUS SPECIES: 7.5 Log (cells/mL)
MEGASPHAERA SPECIES: NOT DETECTED Log (cells/mL)
N. gonorrhoeae RNA, TMA: NOT DETECTED
T. vaginalis RNA, QL TMA: NOT DETECTED

## 2015-04-06 MED ORDER — FLUCONAZOLE 100 MG PO TABS: 100.0000 mg | ORAL_TABLET | Freq: Once | ORAL | Status: DC

## 2015-04-09 ENCOUNTER — Ambulatory Visit (INDEPENDENT_AMBULATORY_CARE_PROVIDER_SITE_OTHER): Payer: PPO | Admitting: Certified Nurse Midwife

## 2015-04-09 VITALS — BP 115/76 | HR 111 | Temp 98.4°F | Wt 253.0 lb

## 2015-04-09 DIAGNOSIS — Z23 Encounter for immunization: Secondary | ICD-10-CM

## 2015-04-09 DIAGNOSIS — Z3403 Encounter for supervision of normal first pregnancy, third trimester: Secondary | ICD-10-CM | POA: Diagnosis not present

## 2015-04-09 LAB — POCT URINALYSIS DIPSTICK
BILIRUBIN UA: NEGATIVE
Glucose, UA: NEGATIVE
KETONES UA: NEGATIVE
Leukocytes, UA: NEGATIVE
NITRITE UA: NEGATIVE
Protein, UA: NEGATIVE
RBC UA: 50
SPEC GRAV UA: 1.01
Urobilinogen, UA: NEGATIVE
pH, UA: 6

## 2015-04-09 MED ORDER — TETANUS-DIPHTH-ACELL PERTUSSIS 5-2.5-18.5 LF-MCG/0.5 IM SUSP
0.5000 mL | Freq: Once | INTRAMUSCULAR | Status: AC
  Administered 2015-04-09: 0.5 mL via INTRAMUSCULAR

## 2015-04-09 NOTE — Progress Notes (Signed)
Subjective:    Rebecca Schmitt is a 29 y.o. female being seen today for her obstetrical visit. She is at [redacted]w[redacted]d gestation. Patient reports no complaints. Fetal movement: normal.  Translator present for exam.   Problem List Items Addressed This Visit    None    Visit Diagnoses    Encounter for supervision of normal first pregnancy in third trimester    -  Primary    Relevant Medications    Tdap (BOOSTRIX) injection 0.5 mL (Completed)    Other Relevant Orders    POCT urinalysis dipstick (Completed)      Patient Active Problem List   Diagnosis Date Noted  . Determine fetal presentation using ultrasound   . [redacted] weeks gestation of pregnancy   . 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 GBS sent TDAP offered  Rhogam given for RH negative Pediatrician: discussed. Infant feeding: plans to breastfeed. Maternity leave: N/A. Cigarette smoking: quit  a few weeks ago. Orders Placed This Encounter  Procedures  . POCT urinalysis dipstick   Meds ordered this encounter  Medications  . Tdap (BOOSTRIX) injection 0.5 mL    Sig:    Follow up in 1 Week.

## 2015-04-16 ENCOUNTER — Ambulatory Visit (INDEPENDENT_AMBULATORY_CARE_PROVIDER_SITE_OTHER): Payer: PPO | Admitting: Certified Nurse Midwife

## 2015-04-16 VITALS — BP 129/80 | HR 111 | Temp 98.8°F | Wt 251.0 lb

## 2015-04-16 DIAGNOSIS — Z3483 Encounter for supervision of other normal pregnancy, third trimester: Secondary | ICD-10-CM

## 2015-04-16 LAB — POCT URINALYSIS DIPSTICK
BILIRUBIN UA: NEGATIVE
GLUCOSE UA: NORMAL
Ketones, UA: NEGATIVE
Leukocytes, UA: NEGATIVE
Nitrite, UA: NEGATIVE
PH UA: 6
Spec Grav, UA: 1.015
Urobilinogen, UA: NEGATIVE

## 2015-04-16 NOTE — Progress Notes (Signed)
Subjective:    Rebecca Schmitt is a 29 y.o. female being seen today for her obstetrical visit. She is at [redacted]w[redacted]d gestation. Patient reports no complaints. Fetal movement: normal.  Interpreter present for exam.    Problem List Items Addressed This Visit    None    Visit Diagnoses    Encounter for supervision of other normal pregnancy in third trimester    -  Primary    Relevant Orders    POCT urinalysis dipstick (Completed)      Patient Active Problem List   Diagnosis Date Noted  . Determine fetal presentation using ultrasound   . [redacted] weeks gestation of pregnancy   . 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 Infant feeding: plans to breastfeed. Cigarette smoking: never smoked. L&D discussion: symptoms of labor, discussed when to call, discussed what number to call, anesthetic/analgesic options reviewed and delivering clinician:  plans no preference. Postpartum supports and preparation: circumcision discussed and contraception plans discussed.  Follow up in 1 Week.

## 2015-04-23 ENCOUNTER — Ambulatory Visit (INDEPENDENT_AMBULATORY_CARE_PROVIDER_SITE_OTHER): Payer: PPO | Admitting: Certified Nurse Midwife

## 2015-04-23 VITALS — BP 127/76 | HR 96 | Temp 99.1°F | Wt 253.0 lb

## 2015-04-23 DIAGNOSIS — Z3403 Encounter for supervision of normal first pregnancy, third trimester: Secondary | ICD-10-CM

## 2015-04-23 LAB — POCT URINALYSIS DIPSTICK
Bilirubin, UA: NEGATIVE
Blood, UA: NEGATIVE
Glucose, UA: NEGATIVE
KETONES UA: NEGATIVE
Leukocytes, UA: NEGATIVE
Nitrite, UA: NEGATIVE
PROTEIN UA: NEGATIVE
SPEC GRAV UA: 1.01
UROBILINOGEN UA: NEGATIVE
pH, UA: 6

## 2015-04-23 NOTE — Progress Notes (Signed)
Subjective:    Rebecca Schmitt is a 29 y.o. female being seen today for her obstetrical visit. She is at [redacted]w[redacted]d gestation. Patient reports no bleeding, no leaking and occasional contractions. Fetal movement: normal.  Discussed L&D plans, desires a natural childbirth.  Interpreter present for exam.    Problem List Items Addressed This Visit    None    Visit Diagnoses    Encounter for supervision of normal first pregnancy in third trimester    -  Primary    Relevant Orders    POCT urinalysis dipstick (Completed)      Patient Active Problem List   Diagnosis Date Noted  . Determine fetal presentation using ultrasound   . [redacted] weeks gestation of pregnancy   . 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  Plan:   Plans for delivery: Vaginal anticipated; labs reviewed; problem list updated Counseling: Consent signed. Infant feeding: plans to breastfeed. Cigarette smoking: quit a few months ago. L&D discussion: symptoms of labor, discussed when to call, discussed what number to call, anesthetic/analgesic options reviewed and delivering clinician:  plans no preference. Postpartum supports and preparation: circumcision discussed and contraception plans discussed.  Follow up in 1 Week.

## 2015-04-30 ENCOUNTER — Ambulatory Visit (INDEPENDENT_AMBULATORY_CARE_PROVIDER_SITE_OTHER): Payer: PPO | Admitting: Certified Nurse Midwife

## 2015-04-30 VITALS — BP 122/88 | HR 110 | Temp 98.4°F | Wt 251.0 lb

## 2015-04-30 DIAGNOSIS — Z3403 Encounter for supervision of normal first pregnancy, third trimester: Secondary | ICD-10-CM

## 2015-04-30 DIAGNOSIS — J069 Acute upper respiratory infection, unspecified: Secondary | ICD-10-CM

## 2015-04-30 DIAGNOSIS — Z3689 Encounter for other specified antenatal screening: Secondary | ICD-10-CM

## 2015-04-30 LAB — POCT URINALYSIS DIPSTICK
BILIRUBIN UA: NEGATIVE
Blood, UA: NEGATIVE
GLUCOSE UA: NEGATIVE
KETONES UA: NEGATIVE
Leukocytes, UA: NEGATIVE
Nitrite, UA: NEGATIVE
Protein, UA: NEGATIVE
Spec Grav, UA: <=1.005
Urobilinogen, UA: NEGATIVE
pH, UA: 6

## 2015-04-30 MED ORDER — AMOXICILLIN-POT CLAVULANATE 875-125 MG PO TABS: 1.0000 | ORAL_TABLET | Freq: Two times a day (BID) | ORAL | Status: DC

## 2015-04-30 MED ORDER — DM-GUAIFENESIN ER 30-600 MG PO TB12: 1.0000 | ORAL_TABLET | Freq: Two times a day (BID) | ORAL | Status: DC

## 2015-04-30 NOTE — Progress Notes (Signed)
Subjective:    Rebecca Schmitt is a 29 y.o. female being seen today for her obstetrical visit. She is at [redacted]w[redacted]d gestation. Patient reports heartburn, no bleeding, no leaking, occasional contractions and sinusitis for over a week with green discharge, coughing up sputum.  States that she has had and increase in vaginal discharge, does not have to wear a panty liner or have leaking down her leg. Denies any fever but has felt chills over the last few days.  Is not currently working.  Fetal movement: normal.  Problem List Items Addressed This Visit    None    Visit Diagnoses    Encounter for supervision of normal first pregnancy in third trimester    -  Primary    Relevant Orders    POCT urinalysis dipstick    US OB Follow Up    SureSwab, Vaginosis/Vaginitis Plus    Encounter for ultrasound to check fetal growth        Relevant Orders    US OB Follow Up    URI (upper respiratory infection)        Relevant Medications    amoxicillin-clavulanate (AUGMENTIN) 875-125 MG per tablet    dextromethorphan-guaiFENesin (MUCINEX DM) 30-600 MG per 12 hr tablet      Patient Active Problem List   Diagnosis Date Noted  . Determine fetal presentation using ultrasound   . [redacted] weeks gestation of pregnancy   . 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  Sinuses: non-tender to palpation.  Nares: + erythremic of septum, + clear nasal drainage.    Negative Fern, negative Nitrazine paper.  Assessment:    Pregnancy @ [redacted]w[redacted]d weeks   URI Plan:    F/U ultrasound for growth Plans for delivery: Vaginal  anticipated; labs reviewed; problem list updated Counseling: Consent signed. Infant feeding: plans to breastfeed. Cigarette smoking: quit a few months ago. L&D discussion: symptoms of labor, discussed when to call, discussed what number to call, anesthetic/analgesic options reviewed and delivering clinician:  plans no preference. Postpartum supports and preparation: circumcision discussed and contraception plans discussed.  Follow up in 1 Week with NST, plan to schedule IOL at next ob visit.

## 2015-05-01 ENCOUNTER — Telehealth: Payer: Self-pay | Admitting: *Deleted

## 2015-05-01 ENCOUNTER — Encounter: Payer: Self-pay | Admitting: *Deleted

## 2015-05-01 NOTE — Telephone Encounter (Signed)
Patient wants to know why she has an [place] appointment tomorrow.

## 2015-05-02 ENCOUNTER — Other Ambulatory Visit: Payer: Self-pay | Admitting: Certified Nurse Midwife

## 2015-05-02 ENCOUNTER — Ambulatory Visit (HOSPITAL_COMMUNITY)
Admission: RE | Admit: 2015-05-02 | Discharge: 2015-05-02 | Disposition: A | Discharge status: Home / Self Care | Payer: Medicaid Other | Source: Ambulatory Visit | Attending: Certified Nurse Midwife | Admitting: Certified Nurse Midwife

## 2015-05-02 DIAGNOSIS — Z3A39 39 weeks gestation of pregnancy: Secondary | ICD-10-CM

## 2015-05-02 DIAGNOSIS — Z3689 Encounter for other specified antenatal screening: Secondary | ICD-10-CM

## 2015-05-02 DIAGNOSIS — O99333 Smoking (tobacco) complicating pregnancy, third trimester: Secondary | ICD-10-CM

## 2015-05-02 DIAGNOSIS — O99213 Obesity complicating pregnancy, third trimester: Secondary | ICD-10-CM | POA: Insufficient documentation

## 2015-05-02 DIAGNOSIS — Z3403 Encounter for supervision of normal first pregnancy, third trimester: Secondary | ICD-10-CM

## 2015-05-02 DIAGNOSIS — O99210 Obesity complicating pregnancy, unspecified trimester: Secondary | ICD-10-CM

## 2015-05-04 LAB — SURESWAB, VAGINOSIS/VAGINITIS PLUS
Atopobium vaginae: NOT DETECTED Log (cells/mL)
C. GLABRATA, DNA: NOT DETECTED
C. TRACHOMATIS RNA, TMA: NOT DETECTED
C. TROPICALIS, DNA: NOT DETECTED
C. albicans, DNA: NOT DETECTED
C. parapsilosis, DNA: NOT DETECTED
GARDNERELLA VAGINALIS: NOT DETECTED Log (cells/mL)
LACTOBACILLUS SPECIES: 7.3 Log (cells/mL)
MEGASPHAERA SPECIES: NOT DETECTED Log (cells/mL)
N. gonorrhoeae RNA, TMA: NOT DETECTED
T. VAGINALIS RNA, QL TMA: NOT DETECTED

## 2015-05-06 ENCOUNTER — Inpatient Hospital Stay (HOSPITAL_COMMUNITY): Admission: AD | Admit: 2015-05-06 | Payer: Medicaid Other | Source: Ambulatory Visit | Admitting: Family Medicine

## 2015-05-07 ENCOUNTER — Ambulatory Visit (INDEPENDENT_AMBULATORY_CARE_PROVIDER_SITE_OTHER): Payer: PPO | Admitting: Obstetrics

## 2015-05-07 VITALS — BP 125/76 | HR 98 | Temp 99.1°F | Wt 256.0 lb

## 2015-05-07 DIAGNOSIS — O368130 Decreased fetal movements, third trimester, not applicable or unspecified: Secondary | ICD-10-CM | POA: Diagnosis not present

## 2015-05-07 DIAGNOSIS — Z3403 Encounter for supervision of normal first pregnancy, third trimester: Secondary | ICD-10-CM

## 2015-05-07 LAB — POCT URINALYSIS DIPSTICK
BILIRUBIN UA: NEGATIVE
Glucose, UA: NEGATIVE
Ketones, UA: NEGATIVE
LEUKOCYTES UA: NEGATIVE
Nitrite, UA: NEGATIVE
PH UA: 7
Protein, UA: NEGATIVE
RBC UA: NEGATIVE
Spec Grav, UA: <=1.005
Urobilinogen, UA: NEGATIVE

## 2015-05-08 ENCOUNTER — Encounter: Payer: Self-pay | Admitting: Obstetrics

## 2015-05-08 ENCOUNTER — Telehealth (HOSPITAL_COMMUNITY): Payer: Self-pay | Admitting: *Deleted

## 2015-05-08 ENCOUNTER — Encounter (HOSPITAL_COMMUNITY): Payer: Self-pay | Admitting: *Deleted

## 2015-05-08 NOTE — Progress Notes (Signed)
Subjective:    Rebecca Schmitt is a 29 y.o. female being seen today for her obstetrical visit. She is at [redacted]w[redacted]d gestation. Patient reports no complaints. Fetal movement: normal.  Problem List Items Addressed This Visit    None    Visit Diagnoses    Encounter for supervision of normal first pregnancy in third trimester    -  Primary    Relevant Orders    POCT urinalysis dipstick (Completed)      Patient Active Problem List   Diagnosis Date Noted  . Determine fetal presentation using ultrasound   . [redacted] weeks gestation of pregnancy   . 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 Induction: scheduled for 05-13-15, written information given.

## 2015-05-08 NOTE — Telephone Encounter (Signed)
Preadmission screen  

## 2015-05-13 ENCOUNTER — Encounter (HOSPITAL_COMMUNITY): Payer: Self-pay

## 2015-05-13 ENCOUNTER — Inpatient Hospital Stay (HOSPITAL_COMMUNITY)
Admission: RE | Admit: 2015-05-13 | Discharge: 2015-05-18 | DRG: 765 | Disposition: A | Discharge status: Home / Self Care | Payer: Medicaid Other | Source: Ambulatory Visit | Attending: Obstetrics | Admitting: Obstetrics

## 2015-05-13 DIAGNOSIS — O480 Post-term pregnancy: Principal | ICD-10-CM | POA: Diagnosis present

## 2015-05-13 DIAGNOSIS — R4701 Aphasia: Secondary | ICD-10-CM | POA: Diagnosis present

## 2015-05-13 DIAGNOSIS — Z87891 Personal history of nicotine dependence: Secondary | ICD-10-CM

## 2015-05-13 DIAGNOSIS — Z3A41 41 weeks gestation of pregnancy: Secondary | ICD-10-CM | POA: Diagnosis not present

## 2015-05-13 DIAGNOSIS — Z6841 Body Mass Index (BMI) 40.0 and over, adult: Secondary | ICD-10-CM | POA: Diagnosis not present

## 2015-05-13 DIAGNOSIS — H9190 Unspecified hearing loss, unspecified ear: Secondary | ICD-10-CM | POA: Diagnosis present

## 2015-05-13 DIAGNOSIS — Z8249 Family history of ischemic heart disease and other diseases of the circulatory system: Secondary | ICD-10-CM | POA: Diagnosis not present

## 2015-05-13 DIAGNOSIS — Z98891 History of uterine scar from previous surgery: Secondary | ICD-10-CM

## 2015-05-13 DIAGNOSIS — Z833 Family history of diabetes mellitus: Secondary | ICD-10-CM | POA: Diagnosis not present

## 2015-05-13 DIAGNOSIS — O339 Maternal care for disproportion, unspecified: Secondary | ICD-10-CM | POA: Diagnosis present

## 2015-05-13 DIAGNOSIS — O99214 Obesity complicating childbirth: Secondary | ICD-10-CM | POA: Diagnosis present

## 2015-05-13 HISTORY — DX: Personal history of nicotine dependence: Z87.891

## 2015-05-13 LAB — TYPE AND SCREEN
ABO/RH(D): O POS
Antibody Screen: NEGATIVE

## 2015-05-13 LAB — CBC
HCT: 36.2 % (ref 36.0–46.0)
Hemoglobin: 12.5 g/dL (ref 12.0–15.0)
MCH: 31.5 pg (ref 26.0–34.0)
MCHC: 34.5 g/dL (ref 30.0–36.0)
MCV: 91.2 fL (ref 78.0–100.0)
PLATELETS: 145 10*3/uL — AB (ref 150–400)
RBC: 3.97 MIL/uL (ref 3.87–5.11)
RDW: 13.4 % (ref 11.5–15.5)
WBC: 12.4 10*3/uL — ABNORMAL HIGH (ref 4.0–10.5)

## 2015-05-13 LAB — ABO/RH: ABO/RH(D): O POS

## 2015-05-13 MED ORDER — OXYCODONE-ACETAMINOPHEN 5-325 MG PO TABS: 2.0000 | ORAL_TABLET | ORAL | Status: DC | PRN

## 2015-05-13 MED ORDER — ONDANSETRON HCL 4 MG/2ML IJ SOLN: 4.0000 mg | Freq: Four times a day (QID) | INTRAMUSCULAR | Status: DC | PRN

## 2015-05-13 MED ORDER — PROMETHAZINE HCL 25 MG/ML IJ SOLN
25.0000 mg | Freq: Four times a day (QID) | INTRAMUSCULAR | Status: DC | PRN
  Administered 2015-05-15: 25 mg via INTRAMUSCULAR
  Filled 2015-05-13: qty 1

## 2015-05-13 MED ORDER — OXYCODONE-ACETAMINOPHEN 5-325 MG PO TABS: 1.0000 | ORAL_TABLET | ORAL | Status: DC | PRN

## 2015-05-13 MED ORDER — NALBUPHINE HCL 10 MG/ML IJ SOLN
10.0000 mg | INTRAMUSCULAR | Status: DC | PRN
  Administered 2015-05-15: 10 mg via INTRAMUSCULAR

## 2015-05-13 MED ORDER — LACTATED RINGERS IV SOLN
INTRAVENOUS | Status: DC
  Administered 2015-05-13 – 2015-05-14 (×4): via INTRAVENOUS

## 2015-05-13 MED ORDER — LIDOCAINE HCL (PF) 1 % IJ SOLN: 30.0000 mL | INTRAMUSCULAR | Status: DC | PRN

## 2015-05-13 MED ORDER — NALBUPHINE HCL 10 MG/ML IJ SOLN
10.0000 mg | INTRAMUSCULAR | Status: DC | PRN
  Administered 2015-05-15: 5 mg via INTRAVENOUS

## 2015-05-13 MED ORDER — FLEET ENEMA 7-19 GM/118ML RE ENEM: 1.0000 | ENEMA | RECTAL | Status: DC | PRN

## 2015-05-13 MED ORDER — OXYTOCIN 40 UNITS IN LACTATED RINGERS INFUSION - SIMPLE MED: 62.5000 mL/h | INTRAVENOUS | Status: DC

## 2015-05-13 MED ORDER — MISOPROSTOL 50MCG HALF TABLET
50.0000 ug | ORAL_TABLET | ORAL | Status: DC
  Administered 2015-05-13 – 2015-05-14 (×5): 50 ug via ORAL
  Filled 2015-05-13 (×5): qty 0.5

## 2015-05-13 MED ORDER — CITRIC ACID-SODIUM CITRATE 334-500 MG/5ML PO SOLN
30.0000 mL | ORAL | Status: DC | PRN
  Administered 2015-05-15: 30 mL via ORAL
  Filled 2015-05-13: qty 15

## 2015-05-13 MED ORDER — LACTATED RINGERS IV SOLN
500.0000 mL | INTRAVENOUS | Status: DC | PRN
  Administered 2015-05-15: 1000 mL via INTRAVENOUS

## 2015-05-13 MED ORDER — FENTANYL CITRATE (PF) 100 MCG/2ML IJ SOLN
100.0000 ug | INTRAMUSCULAR | Status: DC | PRN
  Administered 2015-05-15: 100 ug via INTRAVENOUS
  Filled 2015-05-13: qty 2

## 2015-05-13 MED ORDER — OXYTOCIN BOLUS FROM INFUSION: 500.0000 mL | INTRAVENOUS | Status: DC

## 2015-05-13 MED ORDER — ACETAMINOPHEN 325 MG PO TABS
650.0000 mg | ORAL_TABLET | ORAL | Status: DC | PRN
  Administered 2015-05-14: 650 mg via ORAL
  Filled 2015-05-13: qty 2

## 2015-05-13 MED ORDER — TERBUTALINE SULFATE 1 MG/ML IJ SOLN: 0.2500 mg | Freq: Once | INTRAMUSCULAR | Status: DC | PRN

## 2015-05-13 NOTE — H&P (Signed)
Rebecca Schmitt is a 29 y.o. female presenting for IOL for postdates. History OB History    Gravida Para Term Preterm AB TAB SAB Ectopic Multiple Living   1              Past Medical History  Diagnosis Date  . Deaf-mutism   . Complication of anesthesia     wakes up    History reviewed. No pertinent past surgical history. Family History: family history includes Diabetes in her paternal grandfather and paternal grandmother; Hypertension in her father, maternal grandfather, maternal grandmother, mother, paternal grandfather, and paternal grandmother. Social History:  reports that she quit smoking about 12 months ago. She has never used smokeless tobacco. She reports that she does not drink alcohol or use illicit drugs.   Prenatal Transfer Tool  Maternal Diabetes: No Genetic Screening: Normal Maternal Ultrasounds/Referrals: Normal Fetal Ultrasounds or other Referrals:  None Maternal Substance Abuse:  No Significant Maternal Medications:  None Significant Maternal Lab Results:  None Other Comments:  deaf patient requires intrepreter.    ROS  Dilation: Closed Effacement (%): Thick Station: -2 Exam by:: Lynda Rainwater Rn  Blood pressure 146/78, temperature 98.5 F (36.9 C), temperature source Oral, resp. rate 20, height 5' 6.5" (1.689 m), weight 256 lb (116.121 kg), last menstrual period 07/30/2014. Exam Physical Exam  Prenatal labs: ABO, Rh: O/POS/-- (04/01 1454) Antibody: NEG (04/01 1454) Rubella: 3.23 (04/01 1454) RPR: NON REAC (06/22 1120)  HBsAg: NEGATIVE (04/01 1454)  HIV: NONREACTIVE (06/22 1120)  GBS: NOT DETECTED (08/17 1644)   Assessment/Plan: Cytotec for IOL, pain management with IVP meds until good labor pattern then epidural if patient desires.     Rachelle A Denney 05/13/2015, 8:59 AM

## 2015-05-14 LAB — HIV ANTIBODY (ROUTINE TESTING W REFLEX): HIV SCREEN 4TH GENERATION: NONREACTIVE

## 2015-05-14 LAB — RPR: RPR: NONREACTIVE

## 2015-05-14 MED ORDER — MISOPROSTOL 25 MCG QUARTER TABLET
25.0000 ug | ORAL_TABLET | ORAL | Status: DC | PRN
  Administered 2015-05-14: 25 ug via VAGINAL
  Filled 2015-05-14: qty 0.25
  Filled 2015-05-14: qty 1

## 2015-05-14 MED ORDER — TERBUTALINE SULFATE 1 MG/ML IJ SOLN: 0.2500 mg | Freq: Once | INTRAMUSCULAR | Status: DC | PRN

## 2015-05-14 MED ORDER — OXYTOCIN 40 UNITS IN LACTATED RINGERS INFUSION - SIMPLE MED
1.0000 m[IU]/min | INTRAVENOUS | Status: DC
  Administered 2015-05-14: 14 m[IU]/min via INTRAVENOUS
  Administered 2015-05-14: 2 m[IU]/min via INTRAVENOUS
  Filled 2015-05-14: qty 1000

## 2015-05-14 NOTE — Progress Notes (Signed)
Initiation of pitocin not started due to patient requesting agency interpreter to be at bedside, sister currently at bedside to sign for patient -pt agreeable to being placed on the monitor and IV reconnected - to check cervix and start pit once interpreter at bedside [place], [facility] 04/2815

## 2015-05-14 NOTE — Progress Notes (Signed)
Rebecca Schmitt is a 29 y.o. G1P0 at [redacted]w[redacted]d by ultrasound admitted for induction of labor due to Post dates. Due date 05/06/15.  Subjective:   Patient is getting frustrated with lack of progress.  Encouragement given.    Objective: BP 138/62 mmHg  Pulse 85  Temp(Src) 98.1 F (36.7 C) (Oral)  Resp 20  Ht 5' 6.5" (1.689 m)  Wt 256 lb (116.121 kg)  BMI 40.71 kg/m2  LMP 07/30/2014      FHT:  FHR: 145 bpm, variability: moderate,  accelerations:  Present,  decelerations:  Absent UC:   irregular, every 4-5 minutes SVE:   Dilation: Closed (internally closed) Effacement (%): Thick Station: -2 Exam by:: r denney cnm  Labs: Lab Results  Component Value Date   WBC 12.4* 05/13/2015   HGB 12.5 05/13/2015   HCT 36.2 05/13/2015   MCV 91.2 05/13/2015   PLT 145* 05/13/2015    Assessment / Plan: Induction of labor due to postterm,  Will start pitocin around 1120.    Labor: not in labor yet Preeclampsia:  no signs or symptoms of toxicity Fetal Wellbeing:  Category I Pain Control:  Labor support without medications I/D:  n/a Anticipated MOD:  NSVD  Rachelle A Denney 05/14/2015, 10:17 AM

## 2015-05-15 ENCOUNTER — Encounter (HOSPITAL_COMMUNITY)
Admission: RE | Disposition: A | Discharge status: Home / Self Care | Payer: Self-pay | Source: Ambulatory Visit | Attending: Obstetrics

## 2015-05-15 ENCOUNTER — Inpatient Hospital Stay (HOSPITAL_COMMUNITY): Payer: Medicaid Other | Admitting: Anesthesiology

## 2015-05-15 ENCOUNTER — Encounter (HOSPITAL_COMMUNITY): Payer: Self-pay

## 2015-05-15 DIAGNOSIS — Z98891 History of uterine scar from previous surgery: Secondary | ICD-10-CM

## 2015-05-15 SURGERY — Surgical Case
Anesthesia: Epidural

## 2015-05-15 MED ORDER — NALBUPHINE HCL 10 MG/ML IJ SOLN
5.0000 mg | INTRAMUSCULAR | Status: DC | PRN
  Administered 2015-05-15 – 2015-05-16 (×3): 5 mg via INTRAVENOUS
  Filled 2015-05-15 (×6): qty 0.5

## 2015-05-15 MED ORDER — HYDROMORPHONE HCL 1 MG/ML IJ SOLN
INTRAMUSCULAR | Status: AC
  Filled 2015-05-15: qty 1

## 2015-05-15 MED ORDER — ONDANSETRON HCL 4 MG/2ML IJ SOLN
INTRAMUSCULAR | Status: AC
  Filled 2015-05-15: qty 2

## 2015-05-15 MED ORDER — WITCH HAZEL-GLYCERIN EX PADS: 1.0000 "application " | MEDICATED_PAD | CUTANEOUS | Status: DC | PRN

## 2015-05-15 MED ORDER — LIDOCAINE-EPINEPHRINE (PF) 2 %-1:200000 IJ SOLN
INTRAMUSCULAR | Status: AC
  Filled 2015-05-15: qty 20

## 2015-05-15 MED ORDER — SODIUM CHLORIDE 0.9 % IR SOLN
Status: DC | PRN
  Administered 2015-05-15: 1000 mL

## 2015-05-15 MED ORDER — LACTATED RINGERS IV SOLN
INTRAVENOUS | Status: DC
  Administered 2015-05-16 (×2): via INTRAVENOUS

## 2015-05-15 MED ORDER — LANOLIN HYDROUS EX OINT: 1.0000 "application " | TOPICAL_OINTMENT | CUTANEOUS | Status: DC | PRN

## 2015-05-15 MED ORDER — FENTANYL 2.5 MCG/ML BUPIVACAINE 1/10 % EPIDURAL INFUSION (WH - ANES)
14.0000 mL/h | INTRAMUSCULAR | Status: DC | PRN
  Administered 2015-05-15: 12 mL/h via EPIDURAL
  Administered 2015-05-15: 14 mL/h via EPIDURAL
  Filled 2015-05-15: qty 125

## 2015-05-15 MED ORDER — PHENYLEPHRINE HCL 10 MG/ML IJ SOLN
INTRAMUSCULAR | Status: DC | PRN
  Administered 2015-05-15: 80 ug via INTRAVENOUS

## 2015-05-15 MED ORDER — OXYCODONE-ACETAMINOPHEN 5-325 MG PO TABS
2.0000 | ORAL_TABLET | ORAL | Status: DC | PRN
  Administered 2015-05-17 – 2015-05-18 (×6): 2 via ORAL
  Filled 2015-05-15 (×6): qty 2

## 2015-05-15 MED ORDER — MEPERIDINE HCL 25 MG/ML IJ SOLN
INTRAMUSCULAR | Status: DC | PRN
  Administered 2015-05-15: 12.5 mg via INTRAVENOUS

## 2015-05-15 MED ORDER — ONDANSETRON HCL 4 MG/2ML IJ SOLN
INTRAMUSCULAR | Status: DC | PRN
  Administered 2015-05-15: 4 mg via INTRAVENOUS

## 2015-05-15 MED ORDER — MORPHINE SULFATE (PF) 0.5 MG/ML IJ SOLN
INTRAMUSCULAR | Status: AC
  Filled 2015-05-15: qty 100

## 2015-05-15 MED ORDER — CEFAZOLIN SODIUM-DEXTROSE 2-3 GM-% IV SOLR
INTRAVENOUS | Status: DC | PRN
  Administered 2015-05-15: 2 g via INTRAVENOUS

## 2015-05-15 MED ORDER — TETANUS-DIPHTH-ACELL PERTUSSIS 5-2.5-18.5 LF-MCG/0.5 IM SUSP: 0.5000 mL | Freq: Once | INTRAMUSCULAR | Status: DC

## 2015-05-15 MED ORDER — BUPIVACAINE HCL (PF) 0.25 % IJ SOLN
INTRAMUSCULAR | Status: DC | PRN
  Administered 2015-05-15 (×2): 4 mL via EPIDURAL

## 2015-05-15 MED ORDER — PRENATAL MULTIVITAMIN CH
1.0000 | ORAL_TABLET | Freq: Every day | ORAL | Status: DC
  Administered 2015-05-16 – 2015-05-18 (×3): 1 via ORAL
  Filled 2015-05-15 (×3): qty 1

## 2015-05-15 MED ORDER — LIDOCAINE HCL 1 % IJ SOLN
INTRAMUSCULAR | Status: AC
  Filled 2015-05-15: qty 20

## 2015-05-15 MED ORDER — SCOPOLAMINE 1 MG/3DAYS TD PT72
MEDICATED_PATCH | TRANSDERMAL | Status: DC | PRN
  Administered 2015-05-15: 1 via TRANSDERMAL

## 2015-05-15 MED ORDER — SODIUM BICARBONATE 8.4 % IV SOLN
INTRAVENOUS | Status: DC | PRN
  Administered 2015-05-15: 2 mL via EPIDURAL
  Administered 2015-05-15 (×3): 5 mL via EPIDURAL

## 2015-05-15 MED ORDER — SIMETHICONE 80 MG PO CHEW: 80.0000 mg | CHEWABLE_TABLET | ORAL | Status: DC | PRN

## 2015-05-15 MED ORDER — MEPERIDINE HCL 25 MG/ML IJ SOLN
INTRAMUSCULAR | Status: AC
  Filled 2015-05-15: qty 1

## 2015-05-15 MED ORDER — EPHEDRINE 5 MG/ML INJ: 10.0000 mg | INTRAVENOUS | Status: DC | PRN

## 2015-05-15 MED ORDER — OXYTOCIN 40 UNITS IN LACTATED RINGERS INFUSION - SIMPLE MED: 1.0000 m[IU]/min | INTRAVENOUS | Status: DC

## 2015-05-15 MED ORDER — SIMETHICONE 80 MG PO CHEW
80.0000 mg | CHEWABLE_TABLET | Freq: Three times a day (TID) | ORAL | Status: DC
  Administered 2015-05-16 – 2015-05-18 (×6): 80 mg via ORAL
  Filled 2015-05-15 (×6): qty 1

## 2015-05-15 MED ORDER — MORPHINE SULFATE (PF) 0.5 MG/ML IJ SOLN
INTRAMUSCULAR | Status: DC | PRN
  Administered 2015-05-15: 4 mg via EPIDURAL

## 2015-05-15 MED ORDER — OXYTOCIN 40 UNITS IN LACTATED RINGERS INFUSION - SIMPLE MED: 62.5000 mL/h | INTRAVENOUS | Status: AC

## 2015-05-15 MED ORDER — ZOLPIDEM TARTRATE 5 MG PO TABS: 5.0000 mg | ORAL_TABLET | Freq: Every evening | ORAL | Status: DC | PRN

## 2015-05-15 MED ORDER — DIPHENHYDRAMINE HCL 50 MG/ML IJ SOLN: 12.5000 mg | INTRAMUSCULAR | Status: DC | PRN

## 2015-05-15 MED ORDER — PHENYLEPHRINE 40 MCG/ML (10ML) SYRINGE FOR IV PUSH (FOR BLOOD PRESSURE SUPPORT)
80.0000 ug | PREFILLED_SYRINGE | INTRAVENOUS | Status: DC | PRN
  Filled 2015-05-15: qty 20

## 2015-05-15 MED ORDER — OXYTOCIN 10 UNIT/ML IJ SOLN
40.0000 [IU] | INTRAVENOUS | Status: DC | PRN
  Administered 2015-05-15: 40 [IU] via INTRAVENOUS

## 2015-05-15 MED ORDER — SIMETHICONE 80 MG PO CHEW
80.0000 mg | CHEWABLE_TABLET | ORAL | Status: DC
  Administered 2015-05-17 (×2): 80 mg via ORAL
  Filled 2015-05-15 (×2): qty 1

## 2015-05-15 MED ORDER — ACETAMINOPHEN 325 MG PO TABS
650.0000 mg | ORAL_TABLET | ORAL | Status: DC | PRN
  Administered 2015-05-15: 650 mg via ORAL

## 2015-05-15 MED ORDER — SODIUM BICARBONATE 8.4 % IV SOLN
INTRAVENOUS | Status: AC
  Filled 2015-05-15: qty 50

## 2015-05-15 MED ORDER — LIDOCAINE-EPINEPHRINE (PF) 2 %-1:200000 IJ SOLN
INTRAMUSCULAR | Status: DC | PRN
  Administered 2015-05-15: 4 mL

## 2015-05-15 MED ORDER — IBUPROFEN 600 MG PO TABS
600.0000 mg | ORAL_TABLET | Freq: Four times a day (QID) | ORAL | Status: DC
  Administered 2015-05-16 – 2015-05-18 (×9): 600 mg via ORAL
  Filled 2015-05-15 (×9): qty 1

## 2015-05-15 MED ORDER — HYDROMORPHONE HCL 1 MG/ML IJ SOLN
0.2500 mg | INTRAMUSCULAR | Status: DC | PRN
  Administered 2015-05-15 (×2): 0.5 mg via INTRAVENOUS

## 2015-05-15 MED ORDER — OXYTOCIN 10 UNIT/ML IJ SOLN
INTRAMUSCULAR | Status: AC
  Filled 2015-05-15: qty 4

## 2015-05-15 MED ORDER — SENNOSIDES-DOCUSATE SODIUM 8.6-50 MG PO TABS
2.0000 | ORAL_TABLET | ORAL | Status: DC
  Administered 2015-05-17 (×2): 2 via ORAL
  Filled 2015-05-15 (×2): qty 2

## 2015-05-15 MED ORDER — DIPHENHYDRAMINE HCL 25 MG PO CAPS
25.0000 mg | ORAL_CAPSULE | Freq: Four times a day (QID) | ORAL | Status: DC | PRN
  Administered 2015-05-16 (×2): 25 mg via ORAL
  Filled 2015-05-15 (×2): qty 1

## 2015-05-15 MED ORDER — LACTATED RINGERS IV SOLN
INTRAVENOUS | Status: DC | PRN
  Administered 2015-05-15: 18:00:00 via INTRAVENOUS

## 2015-05-15 MED ORDER — DIBUCAINE 1 % RE OINT: 1.0000 "application " | TOPICAL_OINTMENT | RECTAL | Status: DC | PRN

## 2015-05-15 MED ORDER — ACETAMINOPHEN 325 MG PO TABS
ORAL_TABLET | ORAL | Status: AC
  Filled 2015-05-15: qty 2

## 2015-05-15 MED ORDER — LACTATED RINGERS IV SOLN
INTRAVENOUS | Status: DC | PRN
  Administered 2015-05-15: 17:00:00 via INTRAVENOUS

## 2015-05-15 MED ORDER — OXYCODONE-ACETAMINOPHEN 5-325 MG PO TABS
1.0000 | ORAL_TABLET | ORAL | Status: DC | PRN
  Administered 2015-05-17: 1 via ORAL
  Filled 2015-05-15: qty 1

## 2015-05-15 MED ORDER — MENTHOL 3 MG MT LOZG: 1.0000 | LOZENGE | OROMUCOSAL | Status: DC | PRN

## 2015-05-15 SURGICAL SUPPLY — 38 items
CLAMP CORD UMBIL (MISCELLANEOUS) IMPLANT
CLOTH BEACON ORANGE TIMEOUT ST (SAFETY) ×3 IMPLANT
CONTAINER PREFILL 10% NBF 15ML (MISCELLANEOUS) ×6 IMPLANT
DRAPE SHEET LG 3/4 BI-LAMINATE (DRAPES) IMPLANT
DRSG OPSITE POSTOP 4X10 (GAUZE/BANDAGES/DRESSINGS) ×3 IMPLANT
DURAPREP 26ML APPLICATOR (WOUND CARE) ×3 IMPLANT
ELECT REM PT RETURN 9FT ADLT (ELECTROSURGICAL) ×3
ELECTRODE REM PT RTRN 9FT ADLT (ELECTROSURGICAL) ×1 IMPLANT
EXTRACTOR VACUUM M CUP 4 TUBE (SUCTIONS) IMPLANT
EXTRACTOR VACUUM M CUP 4' TUBE (SUCTIONS)
GLOVE BIO SURGEON STRL SZ8 (GLOVE) ×3 IMPLANT
GOWN STRL REUS W/TWL LRG LVL3 (GOWN DISPOSABLE) ×6 IMPLANT
KIT ABG SYR 3ML LUER SLIP (SYRINGE) IMPLANT
LIQUID BAND (GAUZE/BANDAGES/DRESSINGS) ×3 IMPLANT
NDL HYPO 25X5/8 SAFETYGLIDE (NEEDLE) ×1 IMPLANT
NEEDLE HYPO 22GX1.5 SAFETY (NEEDLE) ×3 IMPLANT
NEEDLE HYPO 25X5/8 SAFETYGLIDE (NEEDLE) ×3 IMPLANT
NS IRRIG 1000ML POUR BTL (IV SOLUTION) ×3 IMPLANT
PACK C SECTION WH (CUSTOM PROCEDURE TRAY) ×3 IMPLANT
PAD OB MATERNITY 4.3X12.25 (PERSONAL CARE ITEMS) ×3 IMPLANT
PENCIL SMOKE EVAC W/HOLSTER (ELECTROSURGICAL) ×3 IMPLANT
RTRCTR C-SECT PINK 25CM LRG (MISCELLANEOUS) ×3 IMPLANT
STAPLER VISISTAT 35W (STAPLE) ×2 IMPLANT
SUT GUT PLAIN 0 CT-3 TAN 27 (SUTURE) IMPLANT
SUT MNCRL 0 VIOLET CTX 36 (SUTURE) ×3 IMPLANT
SUT MNCRL AB 4-0 PS2 18 (SUTURE) IMPLANT
SUT MON AB 2-0 CT1 27 (SUTURE) ×3 IMPLANT
SUT MON AB 3-0 SH 27 (SUTURE)
SUT MON AB 3-0 SH27 (SUTURE) IMPLANT
SUT MONOCRYL 0 CTX 36 (SUTURE) ×6
SUT PLAIN 2 0 XLH (SUTURE) IMPLANT
SUT VIC AB 0 CTX 36 (SUTURE) ×6
SUT VIC AB 0 CTX36XBRD ANBCTRL (SUTURE) ×2 IMPLANT
SUT VIC AB 3-0 SH 27 (SUTURE) ×3
SUT VIC AB 3-0 SH 27X BRD (SUTURE) IMPLANT
SYR CONTROL 10ML LL (SYRINGE) ×3 IMPLANT
TOWEL OR 17X24 6PK STRL BLUE (TOWEL DISPOSABLE) ×3 IMPLANT
TRAY FOLEY CATH SILVER 14FR (SET/KITS/TRAYS/PACK) ×3 IMPLANT

## 2015-05-15 NOTE — Transfer of Care (Signed)
Immediate Anesthesia Transfer of Care Note  Patient: Rebecca Schmitt  Procedure(s) Performed: Procedure(s): CESAREAN SECTION (N/A)  Patient Location: PACU  Anesthesia Type:Epidural  Level of Consciousness: awake, alert , oriented and patient cooperative  Airway & Oxygen Therapy: Patient Spontanous Breathing  Post-op Assessment: Report given to RN and Post -op Vital signs reviewed and stable  Post vital signs: Reviewed and stable  Last Vitals:  Filed Vitals:   05/15/15 1701  BP: 112/89  Pulse: 104  Temp:   Resp:     Complications: No apparent anesthesia complications

## 2015-05-15 NOTE — Progress Notes (Signed)
Denney, CNM called, updated on pt status & FHR tracing. States Dr. Clearance Coots will come evaluate pt around 1700.

## 2015-05-15 NOTE — Op Note (Signed)
Cesarean Section Procedure Note   Wally T MoSIMMONE CAPE/2016 - 05/15/2015  Indications: Failed Induction   Pre-operative Diagnosis: failed induction, arrest of descent .   Post-operative Diagnosis: Same   Surgeon: HARPER,CHARLES A  Assistants: Surgical Technician  Anesthesia: epidural  Procedure Details:  The patient was seen in the Holding Room. The risks, benefits, complications, treatment options, and expected outcomes were discussed with the patient. The patient concurred with the proposed plan, giving informed consent. The patient was identified as Rebecca Schmitt and the procedure verified as C-Section Delivery. A Time Out was held and the above information confirmed.  After induction of anesthesia, the patient was draped and prepped in the usual sterile manner. A transverse incision was made and carried down through the subcutaneous tissue to the fascia. The fascial incision was made and extended transversely. The fascia was separated from the underlying rectus tissue superiorly and inferiorly. The peritoneum was identified and entered. The peritoneal incision was extended longitudinally. The utero-vesical peritoneal reflection was incised transversely and the bladder flap was bluntly freed from the lower uterine segment. A low transverse uterine incision was made. Delivered from cephalic presentation was a 3610 gram living newborn female infant(s). APGAR (1 MIN): 8   APGAR (5 MINS): 9   APGAR (10 MINS):    A cord ph was not sent. The umbilical cord was clamped and cut cord. A sample was obtained for evaluation. The placenta was removed Intact and appeared normal.  The uterine incision was closed with running locked sutures of 0 Monocryl. A second imbricating layer of the same suture was placed.  Hemostasis was observed. The paracolic gutters were irrigated. The parieto peritoneum was closed in a running fashion with 2-0 Vicryl.  The fascia was then reapproximated with running sutures of 0  Vicryl.  The skin was closed with staples.  Instrument, sponge, and needle counts were correct prior the abdominal closure and were correct at the conclusion of the case.    Findings: Normal uterus, ovaries and tubes   Estimated Blood Loss:  Total IV Fluids:   Urine Output: 100CC OF clear urine  Specimens: Placenta to pathology  Complications: no complications  Disposition: PACU - hemodynamically stable.  Maternal Condition: stable   Baby condition / location:  Couplet care / Skin to Skin    Signed: Surgeon(s): Brock Bad, MD

## 2015-05-15 NOTE — Progress Notes (Signed)
Per MD discontinue pitocin x2hrs and have oncoming provider place IUPC.

## 2015-05-15 NOTE — Anesthesia Preprocedure Evaluation (Signed)
Anesthesia Evaluation  Patient identified by MRN, date of birth, ID band Patient awake    Reviewed: Allergy & Precautions, Patient's Chart, lab work & pertinent test results  History of Anesthesia Complications Negative for: history of anesthetic complications  Airway Mallampati: III  TM Distance: >3 FB Neck ROM: Full    Dental  (+) Teeth Intact   Pulmonary former smoker,    breath sounds clear to auscultation       Cardiovascular negative cardio ROS   Rhythm:Regular     Neuro/Psych negative neurological ROS  negative psych ROS   GI/Hepatic negative GI ROS, Neg liver ROS,   Endo/Other  Morbid obesity  Renal/GU negative Renal ROS     Musculoskeletal   Abdominal   Peds  Hematology negative hematology ROS (+)   Anesthesia Other Findings   Reproductive/Obstetrics (+) Pregnancy                             Anesthesia Physical Anesthesia Plan  ASA: II  Anesthesia Plan: Epidural   Post-op Pain Management:    Induction:   Airway Management Planned:   Additional Equipment:   Intra-op Plan:   Post-operative Plan:   Informed Consent: I have reviewed the patients History and Physical, chart, labs and discussed the procedure including the risks, benefits and alternatives for the proposed anesthesia with the patient or authorized representative who has indicated his/her understanding and acceptance.     Plan Discussed with: Anesthesiologist  Anesthesia Plan Comments:         Anesthesia Quick Evaluation

## 2015-05-15 NOTE — Progress Notes (Signed)
Rebecca Schmitt is a 29 y.o. G1P0 at [redacted]w[redacted]d by LMP admitted for induction of labor due to Post dates. Due date 05-06-15.  Subjective:   Objective: BP 133/65 mmHg  Pulse 86  Temp(Src) 98.4 F (36.9 C) (Oral)  Resp 20  Ht 5' 6.5" (1.689 m)  Wt 256 lb (116.121 kg)  BMI 40.71 kg/m2  SpO2 100%  LMP 07/30/2014      FHT:  FHR: 140 bpm, variability: moderate,  accelerations:  Present,  decelerations:  Absent UC:   regular, every 2-4 minutes SVE:   Dilation: 2.5 Effacement (%): 70 Station: -1 Exam by:: C.Okoroji RN-BSN  Labs: Lab Results  Component Value Date   WBC 12.4* 05/13/2015   HGB 12.5 05/13/2015   HCT 36.2 05/13/2015   MCV 91.2 05/13/2015   PLT 145* 05/13/2015    Assessment / Plan: Induction of labor due to postterm,  progressing well on pitocin  Labor: Progressing normally Preeclampsia:  n/a Fetal Wellbeing:  Category I Pain Control:  Epidural I/D:  n/a Anticipated MOD:  NSVD  HARPER,CHARLES A 05/15/2015, 6:38 AM

## 2015-05-15 NOTE — Progress Notes (Signed)
LEIDY MASSAR is a 29 y.o. G1P0 at [redacted]w[redacted]d by LMP admitted for induction of labor due to Post dates. Due date 05-06-15.  Subjective:   Objective: BP 107/59 mmHg  Pulse 102  Temp(Src) 97.5 F (36.4 C) (Axillary)  Resp 16  Ht 5' 6.5" (1.689 m)  Wt 256 lb (116.121 kg)  BMI 40.71 kg/m2  SpO2 96%  LMP 07/30/2014      FHT:  FHR: 140 bpm, variability: moderate,  accelerations:  Present,  decelerations:  Absent UC:   regular, every 2-4 minutes SVE:   Dilation: 3 Effacement (%): 70 Station: -3 Exam by:: Denney, CNM  Labs: Lab Results  Component Value Date   WBC 12.4* 05/13/2015   HGB 12.5 05/13/2015   HCT 36.2 05/13/2015   MCV 91.2 05/13/2015   PLT 145* 05/13/2015    Assessment / Plan: 41 weeks.  IOL.  Membranes now ruptured spontaneously for 24 hours.  Essentially no descent of vertex.  Will proceed with C/S for Failed Induction, Arrest of Descent, Probable CPD  Labor: Not in Labor Preeclampsia:  n/a Fetal Wellbeing:  Category I Pain Control:  Epidural I/D:  n/a Anticipated MOD:  C/S  HARPER,CHARLES A 05/15/2015, 5:18 PM

## 2015-05-15 NOTE — Anesthesia Procedure Notes (Signed)
Epidural Patient location during procedure: OB  Staffing Anesthesiologist: Lavalais, CHRISTOPHER Performed by: anesthesiologist   Preanesthetic Checklist Completed: patient identified, surgical consent, pre-op evaluation, timeout performed, IV checked, risks and benefits discussed and monitors and equipment checked  Epidural Patient position: sitting Prep: DuraPrep Patient monitoring: heart rate, cardiac monitor, continuous pulse ox and blood pressure Approach: midline Location: L3-L4 Injection technique: LOR saline  Needle:  Needle type: Tuohy  Needle gauge: 17 G Needle length: 9 cm Needle insertion depth: 9 cm Catheter type: closed end flexible Catheter size: 19 Gauge Catheter at skin depth: 15 cm Test dose: negative and 2% lidocaine with Epi 1:200 K  Assessment Events: blood not aspirated, injection not painful, no injection resistance, negative IV test and no paresthesia  Additional Notes Reason for block:procedure for pain   

## 2015-05-15 NOTE — Progress Notes (Signed)
Rebecca Schmitt is a 29 y.o. G1P0 at [redacted]w[redacted]d by ultrasound admitted for induction of labor due to Post dates. Due date 05/06/15.  Subjective:   Objective: BP 122/69 mmHg  Pulse 88  Temp(Src) 98.6 F (37 C) (Oral)  Resp 18  Ht 5' 6.5" (1.689 m)  Wt 256 lb (116.121 kg)  BMI 40.71 kg/m2  SpO2 96%  LMP 07/30/2014      FHT:  FHR: 145 bpm, variability: moderate,  accelerations:  Present,  decelerations:  Absent UC:   irregular, every 5-10 minutes SVE:   Dilation: 1.5 Effacement (%): 70 Station: -1 Exam by:: Denney, CNM  Labs: Lab Results  Component Value Date   WBC 12.4* 05/13/2015   HGB 12.5 05/13/2015   HCT 36.2 05/13/2015   MCV 91.2 05/13/2015   PLT 145* 05/13/2015    Assessment / Plan: IOL d/t postdates.  SROM on 05/14/15, IUPC placed 05/15/15 @ 0840, 2 hour pit rest this AM.  Restart pitocin and increase until MVU 180-220.  Labor: Latent phase of IOL Preeclampsia:  no signs or symptoms of toxicity Fetal Wellbeing:  Category I Pain Control:  Epidural this AM I/D:  n/a Anticipated MOD:  NSVD  Rachelle A Denney 05/15/2015, 9:28 AM

## 2015-05-15 NOTE — Anesthesia Postprocedure Evaluation (Signed)
  Anesthesia Post-op Note  Patient: Rebecca Schmitt  Procedure(s) Performed: Procedure(s): CESAREAN SECTION (N/A)  Patient is awake, responsive, moving her legs, and has signs of resolution of her numbness. Pain and nausea are reasonably well controlled. Vital signs are stable and clinically acceptable. Oxygen saturation is clinically acceptable. There are no apparent anesthetic complications at this time. Patient is ready for discharge.

## 2015-05-15 NOTE — Progress Notes (Signed)
Pt refusing to keep blood pressure cuff on. Pt informed blood pressure needs to be taken every 30 minutes.

## 2015-05-16 ENCOUNTER — Encounter (HOSPITAL_COMMUNITY): Payer: Self-pay

## 2015-05-16 LAB — CBC
HEMATOCRIT: 33.3 % — AB (ref 36.0–46.0)
HEMOGLOBIN: 11 g/dL — AB (ref 12.0–15.0)
MCH: 30.7 pg (ref 26.0–34.0)
MCHC: 33 g/dL (ref 30.0–36.0)
MCV: 93 fL (ref 78.0–100.0)
Platelets: 124 10*3/uL — ABNORMAL LOW (ref 150–400)
RBC: 3.58 MIL/uL — ABNORMAL LOW (ref 3.87–5.11)
RDW: 13.3 % (ref 11.5–15.5)
WBC: 10.6 10*3/uL — ABNORMAL HIGH (ref 4.0–10.5)

## 2015-05-16 MED ORDER — KETOROLAC TROMETHAMINE 30 MG/ML IJ SOLN
30.0000 mg | Freq: Four times a day (QID) | INTRAMUSCULAR | Status: DC | PRN
  Administered 2015-05-16: 30 mg via INTRAVENOUS
  Filled 2015-05-16: qty 1

## 2015-05-16 MED ORDER — PNEUMOCOCCAL VAC POLYVALENT 25 MCG/0.5ML IJ INJ
0.5000 mL | INJECTION | INTRAMUSCULAR | Status: DC
  Filled 2015-05-16 (×2): qty 0.5

## 2015-05-16 NOTE — Progress Notes (Signed)
Subjective: Postpartum Day 1: Cesarean Delivery Patient reports tolerating PO and no problems voiding.    Objective: Vital signs in last 24 hours: Temp:  [97.5 F (36.4 C)-99.5 F (37.5 C)] 98.7 F (37.1 C) (09/30 0605) Pulse Rate:  [80-107] 97 (09/30 0605) Resp:  [15-24] 18 (09/30 0605) BP: (94-123)/(46-94) 111/65 mmHg (09/30 0605) SpO2:  [94 %-100 %] 96 % (09/30 4098)  Physical Exam:  General: alert and no distress Lochia: appropriate Uterine Fundus: firm Incision: healing well DVT Evaluation: No evidence of DVT seen on physical exam.   Recent Labs  05/16/15 0525  HGB 11.0*  HCT 33.3*    Assessment/Plan: Status post Cesarean section. Doing well postoperatively.  Continue current care.  HARPER,CHARLES A 05/16/2015, 9:29 AM

## 2015-05-16 NOTE — Addendum Note (Signed)
Addendum  created 05/16/15 0753 by Jhonnie Garner, CRNA   Modules edited: Charges VN, Notes Section   Notes Section:  File: [id number]

## 2015-05-16 NOTE — Anesthesia Postprocedure Evaluation (Signed)
Anesthesia Post Note  Patient: Rebecca Schmitt  Procedure(s) Performed: Procedure(s) (LRB): CESAREAN SECTION (N/A)  Anesthesia type: Epidural  Patient location: Mother/Baby  Post pain: Pain level controlled  Post assessment: Post-op Vital signs reviewed  Last Vitals:  Filed Vitals:   05/16/15 0605  BP: 111/65  Pulse: 97  Temp: 37.1 C  Resp: 18    Post vital signs: Reviewed  Level of consciousness: awake  Complications: No apparent anesthesia complications. Interpretor present for post op visit.

## 2015-05-16 NOTE — Lactation Note (Signed)
This note was copied from the chart of Rebecca Schmitt. Lactation Consultation Note; Karle Plumber sign language interpreter present for visit. Baby sleepy and has not fed much today. Mom reports one good feeding early this morning,. Encouraged mom to undress baby and he woke up and started rooting. Would not latch to bare breast. Attempted with #24 NS but he was on and off the breast. Used #20 NS and he latched and nursed for 10 min then off to sleep. Very small amount of Colostrum noted in NS when he came off the breast,.Mom reports she feels him sucking but no pain. Reviewed holding breast throughout the feeding. Mom has her own pump with her. Encouraged to pump right breast since he is having more trouble getting latched to that breast. To feed all EBM to baby. Discussed cluster feeding tonight. No questions at present. To call for assist prn  Patient Name: Rebecca Anvitha Hutmacher [id number] Date: 05/16/2015 Reason for consult: Follow-up assessment   Maternal Data Formula Feeding for Exclusion: No Has patient been taught Hand Expression?: Yes Does the patient have breastfeeding experience prior to this delivery?: No  Feeding Feeding Type: Breast Fed Length of feed: 10 min  LATCH Score/Interventions Latch: Grasps breast easily, tongue down, lips flanged, rhythmical sucking.  Audible Swallowing: A few with stimulation  Type of Nipple: Flat  Comfort (Breast/Nipple): Soft / non-tender     Hold (Positioning): Assistance needed to correctly position infant at breast and maintain latch. Intervention(s): Breastfeeding basics reviewed;Support Pillows  LATCH Score: 7  Lactation Tools Discussed/Used Tools: Nipple Shields Nipple shield size: 20   Consult Status Consult Status: Follow-up Date: 05/17/15    Pamelia Hoit 05/16/2015, 5:50 PM

## 2015-05-16 NOTE — Progress Notes (Signed)
UR chart review completed.  

## 2015-05-17 ENCOUNTER — Encounter (HOSPITAL_COMMUNITY): Payer: Self-pay | Admitting: Obstetrics

## 2015-05-17 NOTE — Progress Notes (Signed)
Patient ID: Rebecca Schmitt, female   DOB: Oct 01, 1985, 29 y.o.   MRN: [id number] Postop day 2 Blood pressure 122/81 respiration 18 pulse 91 Afebrile Abdomen soft Legs negative Doing well

## 2015-05-18 MED ORDER — PNEUMOCOCCAL VAC POLYVALENT 25 MCG/0.5ML IJ INJ
0.5000 mL | INJECTION | Freq: Once | INTRAMUSCULAR | Status: AC
  Administered 2015-05-18: 0.5 mL via INTRAMUSCULAR
  Filled 2015-05-18: qty 0.5

## 2015-05-18 NOTE — Lactation Note (Signed)
This note was copied from the chart of Rebecca Schmitt. Lactation Consultation Note; Jacki Cones sign language interpreter present for my visit. Mom reports that baby has been nursing well on left breast not as well on right. Just fed for 15 min and is asleep on mom's chest at present. Reports she has DEBP for home but would like manual pump for occasional use.  Given- reviewed setup, use and cleaning and mom reports she understands. Has given some bottles of formula. Reviewed pumping on right breast to promote milk supply and erect nipple. Had DEBP setup in room but mom reports she has not used it but once day before yesterday. Had a "bad day" yesterday and didn't have time to pump. No questions at present. To call prn Patient Name: Rebecca Daylin Eads [id number] Date: 05/18/2015 Reason for consult: Follow-up assessment   Maternal Data Formula Feeding for Exclusion: No Has patient been taught Hand Expression?: Yes Does the patient have breastfeeding experience prior to this delivery?: No  Feeding Feeding Type: Breast Fed Length of feed: 15 min (per mom)  LATCH Score/Interventions                      Lactation Tools Discussed/Used WIC Program: No Pump Review: Setup, frequency, and cleaning Initiated by:: DW Date initiated:: 05/18/15   Consult Status Consult Status: Complete    Pamelia Hoit 05/18/2015, 9:13 AM

## 2015-05-18 NOTE — Discharge Instructions (Signed)
Discharge instructions   You can wash your hair  Shower  Eat what you want  Drink what you want  See me in 6 weeks  Your ankles are going to swell more in the next 2 weeks than when pregnant  No sex for 6 weeks   Jamiaya Bina A, MD 05/18/2015

## 2015-05-18 NOTE — Discharge Summary (Signed)
Obstetric Discharge Summary Reason for Admission: onset of labor Prenatal Procedures: none Intrapartum Procedures: cesarean: low cervical, transverse Postpartum Procedures: none Complications-Operative and Postpartum: none HEMOGLOBIN  Date Value Ref Range Status  05/16/2015 11.0* 12.0 - 15.0 g/dL Final   HCT  Date Value Ref Range Status  05/16/2015 33.3* 36.0 - 46.0 % Final    Physical Exam:  General: alert Lochia: appropriate Uterine Fundus: firm Incision: healing well DVT Evaluation: No evidence of DVT seen on physical exam.  Discharge Diagnoses: Term Pregnancy-delivered  Discharge Information: Date: 05/18/2015 Activity: pelvic rest Diet: routine Medications: Percocet Condition: stable Instructions: refer to practice specific booklet Discharge to: home Follow-up Information    Follow up with HARPER,CHARLES A, MD.   Specialty:  Obstetrics and Gynecology   Contact information:   [address] [place] [address] [phone]       Newborn Data: Live born female  Birth Weight: 7 lb 15.3 oz (3609 g) APGAR: 8, 9  Home with mother.  MARSHALL,BERNARD A 05/18/2015, 6:14 AM

## 2015-05-27 ENCOUNTER — Ambulatory Visit (INDEPENDENT_AMBULATORY_CARE_PROVIDER_SITE_OTHER): Payer: PPO | Admitting: Certified Nurse Midwife

## 2015-05-27 ENCOUNTER — Encounter: Payer: Self-pay | Admitting: Certified Nurse Midwife

## 2015-05-27 VITALS — BP 117/83 | HR 91 | Ht 65.5 in | Wt 231.0 lb

## 2015-05-27 DIAGNOSIS — O9229 Other disorders of breast associated with pregnancy and the puerperium: Secondary | ICD-10-CM

## 2015-05-27 DIAGNOSIS — Z98891 History of uterine scar from previous surgery: Secondary | ICD-10-CM

## 2015-05-27 MED ORDER — IBUPROFEN 800 MG PO TABS: 800.0000 mg | ORAL_TABLET | Freq: Three times a day (TID) | ORAL | Status: DC | PRN

## 2015-05-27 MED ORDER — OXYCODONE-ACETAMINOPHEN 5-325 MG PO TABS: 1.0000 | ORAL_TABLET | ORAL | Status: DC | PRN

## 2015-05-29 ENCOUNTER — Ambulatory Visit: Payer: PPO | Admitting: Certified Nurse Midwife

## 2015-05-29 NOTE — Progress Notes (Signed)
Patient ID: Rebecca BhatShawn T Kustra, female   DOB: 04/30/1986, 29 y.o.   MRN: [id number]  Subjective:     Rebecca Schmitt is a 29 y.o. female who presents for a postpartum visit. She is 2 weeks postpartum following a low cervical transverse Cesarean section. I have fully reviewed the prenatal and intrapartum course. The delivery was at 40 gestational weeks. Outcome: primary cesarean section, low transverse incision. Anesthesia: epidural. Postpartum course has been normal. Baby's course has been normal. Baby is feeding by breast. Bleeding thin lochia and pink. Bowel function is normal. Bladder function is normal. Patient is not sexually active. Contraception method is abstinence. Postpartum depression screening: negative: 3.  Tobacco, alcohol and substance abuse history reviewed.  Adult immunizations reviewed including TDAP, rubella and varicella.  The following portions of the patient's history were reviewed and updated as appropriate: allergies, current medications, past family history, past medical history, past social history, past surgical history and problem list.  Review of Systems A comprehensive review of systems was negative.   Objective:    BP 117/83 mmHg  Pulse 91  Ht 5' 5.5" (1.664 m)  Wt 231 lb (104.781 kg)  BMI 37.84 kg/m2  LMP 07/30/2014  Breastfeeding? Yes  General:  alert, cooperative and no distress   Breasts:  inspection negative, no nipple discharge or bleeding, no masses or nodularity palpable  Lungs: clear to auscultation bilaterally  Heart:  regular rate and rhythm, S1, S2 normal, no murmur, click, rub or gallop  Abdomen: soft, non-tender; bowel sounds normal; no masses,  no organomegaly   Vulva:  not evaluated  Vagina: not evaluated  Cervix:  not evaluatecd  Corpus: not examined  Adnexa:  not evaluated  Rectal Exam: Not performed.          50% of 30 min visit spent on counseling and coordination of care.  Assessment:     Normal 2 week postpartum exam. Pap smear not done  at today's visit.  Plan:    1. Contraception: abstinence 2. Discussed contraception options, patient desires a repeat of her Mirena at her 6 week postpartum visit, no other birth control works for her.  States it was very painful to have the Mirena placed last time due to the position of her cervix. 3. Follow up in: 4 weeks or as needed.  2hr GTT for h/o GDM/screening for DM q 3 yrs per ADA recommendations Preconception counseling provided Healthy lifestyle practices reviewed

## 2015-06-24 ENCOUNTER — Encounter: Payer: Self-pay | Admitting: Certified Nurse Midwife

## 2015-06-24 ENCOUNTER — Ambulatory Visit (INDEPENDENT_AMBULATORY_CARE_PROVIDER_SITE_OTHER): Payer: Medicaid Other | Admitting: Certified Nurse Midwife

## 2015-06-24 VITALS — BP 116/69 | HR 87 | Temp 98.3°F | Wt 232.0 lb

## 2015-06-24 DIAGNOSIS — N760 Acute vaginitis: Secondary | ICD-10-CM

## 2015-06-24 DIAGNOSIS — Z23 Encounter for immunization: Secondary | ICD-10-CM | POA: Diagnosis not present

## 2015-06-24 MED ORDER — FLUCONAZOLE 100 MG PO TABS: 100.0000 mg | ORAL_TABLET | Freq: Once | ORAL | Status: DC

## 2015-06-24 MED ORDER — TERCONAZOLE 0.4 % VA CREA: 1.0000 | TOPICAL_CREAM | Freq: Every day | VAGINAL | Status: DC

## 2015-06-24 NOTE — Progress Notes (Signed)
Patient ID: Rebecca Schmitt, female   DOB: 06/17/1986, 29 y.o.   MRN: [id number]  Subjective:     Rebecca Schmitt is a 29 y.o. female who presents for a postpartum visit. She is 6 weeks postpartum following a low cervical transverse Cesarean section. I have fully reviewed the prenatal and intrapartum course. The delivery was at 40 gestational weeks. Outcome: primary cesarean section, low transverse incision. Anesthesia: epidural. Postpartum course has been normal. Baby's course has been normal. Baby is feeding by breast. Bleeding staining only and brown. Bowel function is normal. Bladder function is normal. Patient is not sexually active. Contraception method is abstinence. Postpartum depression screening: negative.  Tobacco, alcohol and substance abuse history reviewed.  Adult immunizations reviewed including TDAP, rubella and varicella.  The following portions of the patient's history were reviewed and updated as appropriate: allergies, current medications, past family history, past medical history, past social history, past surgical history and problem list.  Review of Systems Pertinent items noted in HPI and remainder of comprehensive ROS otherwise negative.   Objective:    BP 116/69 mmHg  Pulse 87  Temp(Src) 98.3 F (36.8 C)  Wt 232 lb (105.235 kg)  Breastfeeding? Yes  General:  alert, cooperative and no distress   Breasts:  inspection negative, no nipple discharge or bleeding, no masses or nodularity palpable  Lungs: normal percussion bilaterally  Heart:  regular rate and rhythm, S1, S2 normal, no murmur, click, rub or gallop  Abdomen: soft, non-tender; bowel sounds normal; no masses,  no organomegaly   Vulva:  normal  Vagina: vagina negative for erythema, foreign body, mass  and vaginal tear/laceration none and vagina positive for chunky white discharge  Cervix:  no bleeding following Pap, no cervical motion tenderness and no lesions  Corpus: normal size, contour, position, consistency,  mobility, non-tender  Adnexa:  normal adnexa  Rectal Exam: Not performed.          50% of 30 min visit spent on counseling and coordination of care.  Assessment:     Normal 6 week postpartum exam. Pap smear done at today's visit.    Vaginal candidiasis  Influenza vaciene  Plan:    1. Contraception: abstinence 2. Desires IUD 3. Follow up in: 1 month or as needed.  2hr GTT for h/o GDM/screening for DM q 3 yrs per ADA recommendations Preconception counseling provided Healthy lifestyle practices reviewed

## 2015-06-24 NOTE — Addendum Note (Signed)
Addended by: Elby BeckPAUL, Mont Jagoda F on: 06/24/2015 03:31 PM   Modules accepted: Orders

## 2015-06-27 LAB — SURESWAB, VAGINOSIS/VAGINITIS PLUS
ATOPOBIUM VAGINAE: NOT DETECTED Log (cells/mL)
C. ALBICANS, DNA: NOT DETECTED
C. TRACHOMATIS RNA, TMA: NOT DETECTED
C. glabrata, DNA: NOT DETECTED
C. parapsilosis, DNA: NOT DETECTED
C. tropicalis, DNA: NOT DETECTED
Gardnerella vaginalis: NOT DETECTED Log (cells/mL)
LACTOBACILLUS SPECIES: 6.8 Log (cells/mL)
MEGASPHAERA SPECIES: NOT DETECTED Log (cells/mL)
N. gonorrhoeae RNA, TMA: NOT DETECTED
T. VAGINALIS RNA, QL TMA: NOT DETECTED

## 2015-07-07 ENCOUNTER — Telehealth: Payer: Self-pay | Admitting: *Deleted

## 2015-07-07 ENCOUNTER — Ambulatory Visit (INDEPENDENT_AMBULATORY_CARE_PROVIDER_SITE_OTHER): Payer: Medicaid Other | Admitting: Certified Nurse Midwife

## 2015-07-07 ENCOUNTER — Encounter: Payer: Self-pay | Admitting: Certified Nurse Midwife

## 2015-07-07 DIAGNOSIS — Z30014 Encounter for initial prescription of intrauterine contraceptive device: Secondary | ICD-10-CM

## 2015-07-07 DIAGNOSIS — Z3202 Encounter for pregnancy test, result negative: Secondary | ICD-10-CM

## 2015-07-07 DIAGNOSIS — Z30430 Encounter for insertion of intrauterine contraceptive device: Secondary | ICD-10-CM | POA: Diagnosis not present

## 2015-07-07 DIAGNOSIS — O909 Complication of the puerperium, unspecified: Secondary | ICD-10-CM

## 2015-07-07 LAB — POCT URINE PREGNANCY: PREG TEST UR: NEGATIVE

## 2015-07-07 MED ORDER — TRIPLE ANTIBIOTIC 5-400-5000 EX OINT: TOPICAL_OINTMENT | Freq: Four times a day (QID) | CUTANEOUS | Status: DC

## 2015-07-07 NOTE — Telephone Encounter (Signed)
Patient thinks she has an infection at her incision 1:15 LM on VM to CB

## 2015-07-07 NOTE — Addendum Note (Signed)
Addended by: Henriette CombsHATTON, Daouda Lonzo L on: 07/07/2015 05:03 PM   Modules accepted: Orders

## 2015-07-07 NOTE — Progress Notes (Signed)
Patient ID: Rebecca Schmitt, female   DOB: 04/15/1986, 29 y.o.   MRN: [id number]  Patient ID: Rebecca Schmitt, female   DOB: 01/10/1986, 29 y.o.   MRN: [id number]  No chief complaint on file.   HPI Rebecca Schmitt is a 29 y.o. female.  Here with c/o incisional irritation for a few days, she noticed that it was red and had a foul odor.  Denies any fever, pain or pus from the wound.   HPI  Past Medical History  Diagnosis Date  . Deaf-mutism   . Complication of anesthesia     wakes up   . Former smoker     Past Surgical History  Procedure Laterality Date  . Cesarean section N/A 05/15/2015    Procedure: CESAREAN SECTION;  Surgeon: Brock Badharles A Harper, MD;  Location: [facility];  Service: Obstetrics;  Laterality: N/A;    Family History  Problem Relation Age of Onset  . Hypertension Father   . Hypertension Mother   . Hypertension Maternal Grandmother   . Hypertension Maternal Grandfather   . Hypertension Paternal Grandmother   . Diabetes Paternal Grandmother   . Hypertension Paternal Grandfather   . Diabetes Paternal Grandfather     Social History Social History  Substance Use Topics  . Smoking status: Former Smoker    Quit date: 04/19/2014  . Smokeless tobacco: Never Used  . Alcohol Use: No    Allergies  Allergen Reactions  . Estrogens Hives    Allergy not certain    Current Outpatient Prescriptions  Medication Sig Dispense Refill  . fluconazole (DIFLUCAN) 100 MG tablet Take 1 tablet (100 mg total) by mouth once. Repeat dose in 48-72 hour. 3 tablet 0  . neomycin-bacitracin-polymyxin (NEOSPORIN) [phone] ointment Apply topically 4 (four) times daily. 28.3 g 0  . Prenatal Multivit-Min-Fe-FA (PRENATAL VITAMINS PO) Take by mouth.    . terconazole (TERAZOL 7) 0.4 % vaginal cream Place 1 applicator vaginally at bedtime. 45 g 0   No current facility-administered medications for this visit.    Review of Systems Review of Systems Constitutional: negative for fatigue and weight  loss Respiratory: negative for cough and wheezing Cardiovascular: negative for chest pain, fatigue and palpitations Gastrointestinal: negative for abdominal pain and change in bowel habits Genitourinary:negative Integument/breast: negative for nipple discharge Musculoskeletal:negative for myalgias Neurological: negative for gait problems and tremors Behavioral/Psych: negative for abusive relationship, depression Endocrine: negative for temperature intolerance     currently breastfeeding.  Physical Exam Physical Exam General:   alert  Skin:   no rash or abnormalities  Lungs:   clear to auscultation bilaterally  Heart:   regular rate and rhythm, S1, S2 normal, no murmur, click, rub or gallop  Breasts:   deferred  Abdomen:  normal findings: no organomegaly, soft, non-tender and no hernia  Pelvis:  External genitalia: normal general appearance Urinary system: urethral meatus normal and bladder without fullness, nontender Vaginal: normal without tenderness, induration or masses Cervix: normal appearance Adnexa: normal bimanual exam Uterus: anteverted and non-tender, normal size   Incision: slightly erythremic about 1-2 cm in size on the incisional line, no pus.   50% of 15 min visit spent on counseling and coordination of care.   Data Reviewed Previous medical hx, labs, meds  Assessment     C-section scar irrigation from clothing.       Plan   Should resolve on its own.  Encouraged patient to keep the incision clean and dry and reduce irritation from clothing on that site.  Patient given f/u instructions ie: fever, incisional pain, drainage, etc.   No orders of the defined types were placed in this encounter.   Meds ordered this encounter  Medications  . neomycin-bacitracin-polymyxin (NEOSPORIN) [phone] ointment    Sig: Apply topically 4 (four) times daily.    Dispense:  28.3 g    Refill:  0   Patient stated she has been on her period and has IUD appointment for  December 9th, decision to go ahead with IUD insertion today given her period currently on.  Has had an IUD in the past.   of Motrin PO given in office.      IUD Procedure Note   DIAGNOSIS: Desires long-term, reversible contraception   PROCEDURE: IUD placement Performing Provider: Orvilla Cornwall CNM  Patient counseled prior to procedure. I explained risks and benefits of  Mirena IUD, reviewed alternative forms of contraception. Patient stated understanding and consented to continue with procedure.   LMP:07/02/15 Pregnancy Test: Negative Lot #: [id number] Expiration Date: 06/19   IUD type: [ X ] Mirena   [    ] Paraguard    PROCEDURE:  Timeout procedure was performed to ensure right patient and right site.  A bimanual exam was performed to determine the position of the uterus, retroverted. The speculum was placed. The vagina and cervix was sterilized in the usual manner and sterile technique was maintained throughout the course of the procedure. A single toothed tenaculum was not required. The depth of the uterus was sounded to 9 cm. The IUD was inserted to the appropriate depth and inserted without difficulty.  The string was cut to an estimated 4 cm length. Bleeding was minimal. The patient tolerated the procedure well.   Follow up: The patient tolerated the procedure well without complications.  Standard post-procedure care is explained and return precautions are given.  Orvilla Cornwall CNM

## 2015-07-08 NOTE — Telephone Encounter (Signed)
Patient was seen is the office

## 2015-07-24 ENCOUNTER — Ambulatory Visit: Payer: Self-pay | Admitting: Certified Nurse Midwife

## 2015-08-05 ENCOUNTER — Encounter: Payer: Self-pay | Admitting: Certified Nurse Midwife

## 2015-08-05 ENCOUNTER — Ambulatory Visit (INDEPENDENT_AMBULATORY_CARE_PROVIDER_SITE_OTHER): Payer: Medicaid Other | Admitting: Certified Nurse Midwife

## 2015-08-05 VITALS — BP 112/74 | HR 92 | Temp 98.4°F | Wt 232.0 lb

## 2015-08-05 DIAGNOSIS — Z30431 Encounter for routine checking of intrauterine contraceptive device: Secondary | ICD-10-CM

## 2015-08-05 DIAGNOSIS — T8389XA Other specified complication of genitourinary prosthetic devices, implants and grafts, initial encounter: Secondary | ICD-10-CM

## 2015-08-05 DIAGNOSIS — T839XXA Unspecified complication of genitourinary prosthetic device, implant and graft, initial encounter: Secondary | ICD-10-CM

## 2015-08-05 NOTE — Progress Notes (Signed)
Patient ID: Rebecca Schmitt, female   DOB: 11/19/1985, 29 y.o.   MRN: [id number]   Chief Complaint  Patient presents with  . Gynecologic Exam    Check incision and IUD follow up    HPI Rebecca Schmitt is a 29 y.o. female.  Here for 1 month string check.  States has been having period like bleeding since insertion.  Denies any problems with it.  Is currently sexually active.  Still breastfeeding and doing well. Denies any heavy bleeding.  States that estrogen gives her hives.   Here for exam with her interpreter.     HPI  Past Medical History  Diagnosis Date  . Deaf-mutism   . Complication of anesthesia     wakes up   . Former smoker     Past Surgical History  Procedure Laterality Date  . Cesarean section N/A 05/15/2015    Procedure: CESAREAN SECTION;  Surgeon: Brock Bad, MD;  Location: [facility];  Service: Obstetrics;  Laterality: N/A;    Family History  Problem Relation Age of Onset  . Hypertension Father   . Hypertension Mother   . Hypertension Maternal Grandmother   . Hypertension Maternal Grandfather   . Hypertension Paternal Grandmother   . Diabetes Paternal Grandmother   . Hypertension Paternal Grandfather   . Diabetes Paternal Grandfather     Social History Social History  Substance Use Topics  . Smoking status: Former Smoker    Quit date: 04/19/2014  . Smokeless tobacco: Never Used  . Alcohol Use: No     Comment: rare    Allergies  Allergen Reactions  . Estrogens Hives    Allergy not certain    Current Outpatient Prescriptions  Medication Sig Dispense Refill  . levonorgestrel (MIRENA) 20 MCG/24HR IUD 1 each by Intrauterine route once.    . Prenatal Multivit-Min-Fe-FA (PRENATAL VITAMINS PO) Take by mouth.     No current facility-administered medications for this visit.    Review of Systems Review of Systems Constitutional: negative for fatigue and weight loss Respiratory: negative for cough and wheezing Cardiovascular: negative for chest pain,  fatigue and palpitations Gastrointestinal: negative for abdominal pain and change in bowel habits Genitourinary:negative Integument/breast: negative for nipple discharge Musculoskeletal:negative for myalgias Neurological: negative for gait problems and tremors Behavioral/Psych: negative for abusive relationship, depression Endocrine: negative for temperature intolerance     Blood pressure 112/74, pulse 92, temperature 98.4 F (36.9 C), weight 232 lb (105.235 kg), currently breastfeeding.  Physical Exam Physical Exam General:   alert  Skin:   no rash or abnormalities  Lungs:   clear to auscultation bilaterally  Heart:   regular rate and rhythm, S1, S2 normal, no murmur, click, rub or gallop  Abdomen:  normal findings: no organomegaly, soft, non-tender and no hernia  Pelvis:  External genitalia: normal general appearance Urinary system: urethral meatus normal and bladder without fullness, nontender Vaginal: normal without tenderness, induration or masses Cervix: normal appearance, no IUD strings present, no CMT Adnexa: normal bimanual exam Uterus: anteverted and non-tender, normal size    50% of 15 min visit spent on counseling and coordination of care.   Data Reviewed Previous medical hx, meds  Assessment     IUD complication: IUD strings not present on exam     Plan    Orders Placed This Encounter  Procedures  . US Transvaginal Non-OB    Standing Status: Future     Number of Occurrences:      Standing Expiration Date: 10/05/2016  Order Specific Question:  Reason for Exam (SYMPTOM  OR DIAGNOSIS REQUIRED)    Answer:  IUD complication, no strings on exam    Order Specific Question:  Preferred imaging location?    Answer:  Internal  . US Pelvis Complete    Standing Status: Future     Number of Occurrences:      Standing Expiration Date: 10/05/2016    Order Specific Question:  Reason for Exam (SYMPTOM  OR DIAGNOSIS REQUIRED)    Answer:  IUD complication, no strings on  exam    Order Specific Question:  Preferred imaging location?    Answer:  Internal   Meds ordered this encounter  Medications  . levonorgestrel (MIRENA) 20 MCG/24HR IUD    Sig: 1 each by Intrauterine route once.    Possible management options include: Another non-estrogen birth control.  Follow up after [place].

## 2015-08-07 ENCOUNTER — Ambulatory Visit (INDEPENDENT_AMBULATORY_CARE_PROVIDER_SITE_OTHER): Payer: Medicaid Other

## 2015-08-07 ENCOUNTER — Ambulatory Visit (INDEPENDENT_AMBULATORY_CARE_PROVIDER_SITE_OTHER): Payer: Medicaid Other | Admitting: Certified Nurse Midwife

## 2015-08-07 ENCOUNTER — Encounter: Payer: Self-pay | Admitting: Certified Nurse Midwife

## 2015-08-07 DIAGNOSIS — Z30431 Encounter for routine checking of intrauterine contraceptive device: Secondary | ICD-10-CM | POA: Diagnosis not present

## 2015-08-07 DIAGNOSIS — N939 Abnormal uterine and vaginal bleeding, unspecified: Secondary | ICD-10-CM

## 2015-08-07 DIAGNOSIS — T8389XA Other specified complication of genitourinary prosthetic devices, implants and grafts, initial encounter: Secondary | ICD-10-CM

## 2015-08-07 DIAGNOSIS — T839XXA Unspecified complication of genitourinary prosthetic device, implant and graft, initial encounter: Secondary | ICD-10-CM

## 2015-08-07 NOTE — Progress Notes (Signed)
Patient ID: Rebecca Schmitt, female   DOB: 04/15/1986, 29 y.o.   MRN: [id number]  No chief complaint on file.   HPI Rebecca Schmitt is a 29 y.o. female.  Here for f/u on pelvic US, d/t IUD complication.  Has been bleeding for 9 days, tolerable.  Does plan another pregnancy soon.  IUD strings not seen on physical exam the other day.  [place] ordered for IUD placement.  Patient has retroverted uterus.  Strings on [place] present above the inner os.  Discussed possibility of surgical intervention to remove if not able to grasp strings on exam when she desires removal.  Patient verbalized understanding that the IUD is in place the strings however have migrated into upper cervical canal.    HPI  Past Medical History  Diagnosis Date  . Deaf-mutism   . Complication of anesthesia     wakes up   . Former smoker     Past Surgical History  Procedure Laterality Date  . Cesarean section N/A 05/15/2015    Procedure: CESAREAN SECTION;  Surgeon: Brock Bad, MD;  Location: [facility];  Service: Obstetrics;  Laterality: N/A;    Family History  Problem Relation Age of Onset  . Hypertension Father   . Hypertension Mother   . Hypertension Maternal Grandmother   . Hypertension Maternal Grandfather   . Hypertension Paternal Grandmother   . Diabetes Paternal Grandmother   . Hypertension Paternal Grandfather   . Diabetes Paternal Grandfather     Social History Social History  Substance Use Topics  . Smoking status: Former Smoker    Quit date: 04/19/2014  . Smokeless tobacco: Never Used  . Alcohol Use: No     Comment: rare    Allergies  Allergen Reactions  . Estrogens Hives    Allergy not certain    Current Outpatient Prescriptions  Medication Sig Dispense Refill  . levonorgestrel (MIRENA) 20 MCG/24HR IUD 1 each by Intrauterine route once.    . Prenatal Multivit-Min-Fe-FA (PRENATAL VITAMINS PO) Take by mouth.     No current facility-administered medications for this visit.    Review of  Systems Review of Systems Constitutional: negative for fatigue and weight loss Respiratory: negative for cough and wheezing Cardiovascular: negative for chest pain, fatigue and palpitations Gastrointestinal: negative for abdominal pain and change in bowel habits Genitourinary:negative Integument/breast: negative for nipple discharge Musculoskeletal:negative for myalgias Neurological: negative for gait problems and tremors Behavioral/Psych: negative for abusive relationship, depression Endocrine: negative for temperature intolerance     currently breastfeeding.  Physical Exam Physical Exam General:   alert  Skin:   no rash or abnormalities  Lungs:   clear to auscultation bilaterally  Heart:   regular rate and rhythm, S1, S2 normal, no murmur, click, rub or gallop  Breasts:   normal without suspicious masses, skin or nipple changes or axillary nodes  Abdomen:  normal findings: no organomegaly, soft, non-tender and no hernia  Pelvis:  External genitalia: normal general appearance Urinary system: urethral meatus normal and bladder without fullness, nontender Vaginal: normal without tenderness, induration or masses Cervix: normal appearance Adnexa: normal bimanual exam Uterus: anteverted and non-tender, normal size    100% of 15 min visit spent on counseling and coordination of care.   Data Reviewed Previous medical hx, meds  Assessment     IUD complication, strings not present on exam Retroverted uterus     Plan    No orders of the defined types were placed in this encounter.   No orders  of the defined types were placed in this encounter.     Possible management options include: another form of contraception Follow up as needed or in one year for annual exam.

## 2015-12-01 ENCOUNTER — Other Ambulatory Visit: Payer: Self-pay | Admitting: Certified Nurse Midwife

## 2016-02-21 ENCOUNTER — Other Ambulatory Visit: Payer: Self-pay | Admitting: Certified Nurse Midwife

## 2016-06-09 ENCOUNTER — Ambulatory Visit (INDEPENDENT_AMBULATORY_CARE_PROVIDER_SITE_OTHER): Payer: Medicaid Other | Admitting: Certified Nurse Midwife

## 2016-06-09 ENCOUNTER — Encounter: Payer: Self-pay | Admitting: Certified Nurse Midwife

## 2016-06-09 VITALS — BP 101/75 | HR 85 | Wt 192.0 lb

## 2016-06-09 DIAGNOSIS — Z716 Tobacco abuse counseling: Secondary | ICD-10-CM

## 2016-06-09 DIAGNOSIS — Z30432 Encounter for removal of intrauterine contraceptive device: Secondary | ICD-10-CM

## 2016-06-09 DIAGNOSIS — Z3169 Encounter for other general counseling and advice on procreation: Secondary | ICD-10-CM

## 2016-06-09 MED ORDER — VARENICLINE TARTRATE 0.5 MG X 11 & 1 MG X 42 PO MISC: ORAL | 0 refills | Status: DC

## 2016-06-09 MED ORDER — VARENICLINE TARTRATE 1 MG PO TABS: 1.0000 mg | ORAL_TABLET | Freq: Two times a day (BID) | ORAL | 5 refills | Status: DC

## 2016-06-09 MED ORDER — CITRANATAL BLOOM 90-1 MG PO TABS: 1.0000 | ORAL_TABLET | Freq: Every day | ORAL | 12 refills | Status: AC

## 2016-06-09 NOTE — Addendum Note (Signed)
Addended by: Samantha CrimesENNEY, Dawud Mays ANNE on: 06/09/2016 01:54 PM   Modules accepted: Orders

## 2016-06-09 NOTE — Progress Notes (Signed)
Procedure note: MIRENA REMOVAL   Reasons  for removal:  Desires removal for future pregnancy.  Is not having periods.     A timeout was performed confirming the patient, the procedure and allergy status. The patient was placed in the lithotomy position, a speculum was placed.  Cervix was visualized. Strings not present, long forceps and hurricane spray used.  Long forceps used in a strile manner by Dr. Clearance Cootsharper.  Stings grasped with long forceps and device removed intact.  The patient tolerated the procedure well.  New contraceptive method: no method, natural family planning (NFP).  Prenatal vitamins given to the patient.    A&P: IUD removed intact  Smoking cessation counseling given:  1800 Quit line given, Chantix started.  Had previously quit smoking.  FOB takes care of son during the day.  She is working currently.  Desires future pregnancy during the summer.  NFP methods encouraged: has ovulation tracking app on her phone. PNV sent to the pharmacy.  She is currently still breast feeding her son.

## 2016-09-13 IMAGING — US US MFM OB FOLLOW-UP
1 series · 12 of 26 positions shown · non-contrast
Comparison: none

[Series 1: us mfm ob follow-up · 0.26mm/px · 12 of 26 slices shown]
[im 2/26]
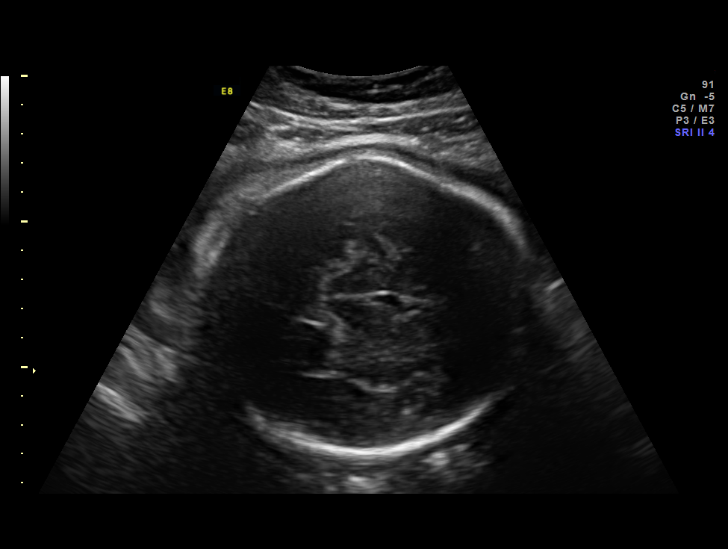
[im 4/26]
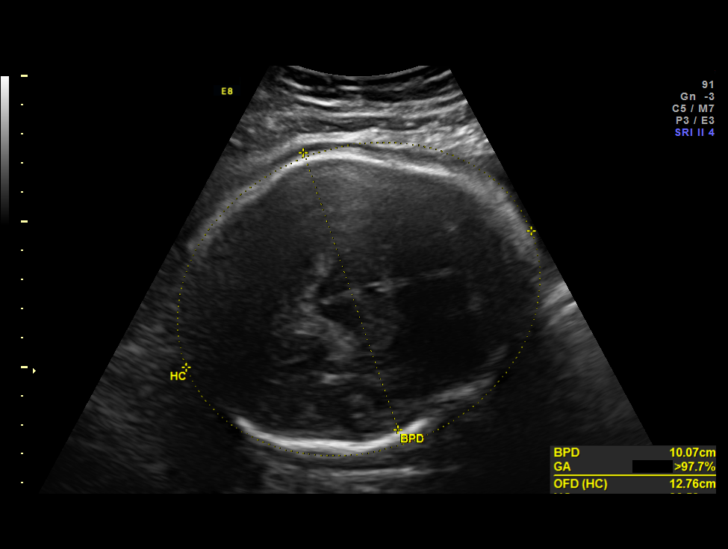
[im 6/26]
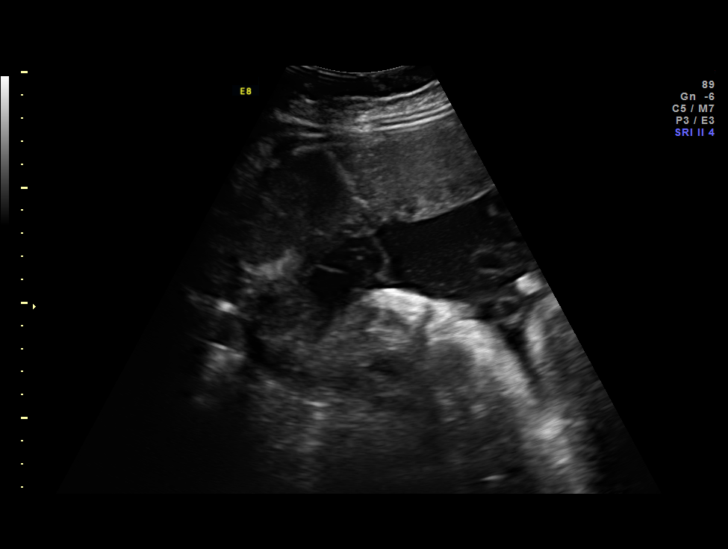
[im 8/26]
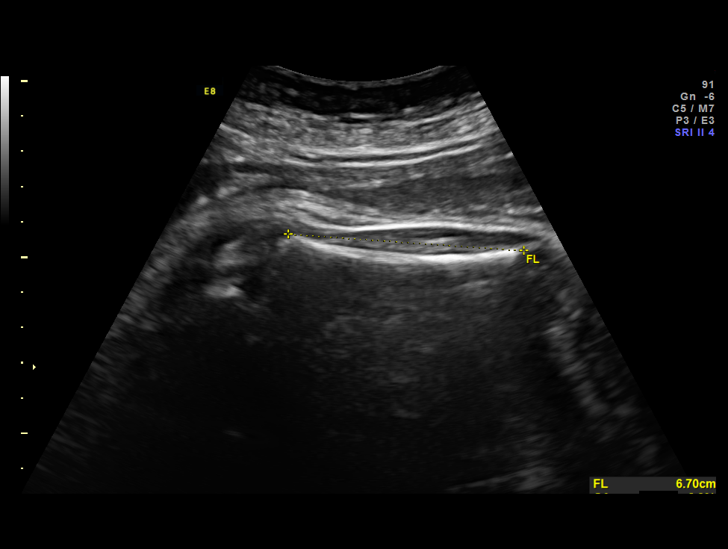
[im 10/26]
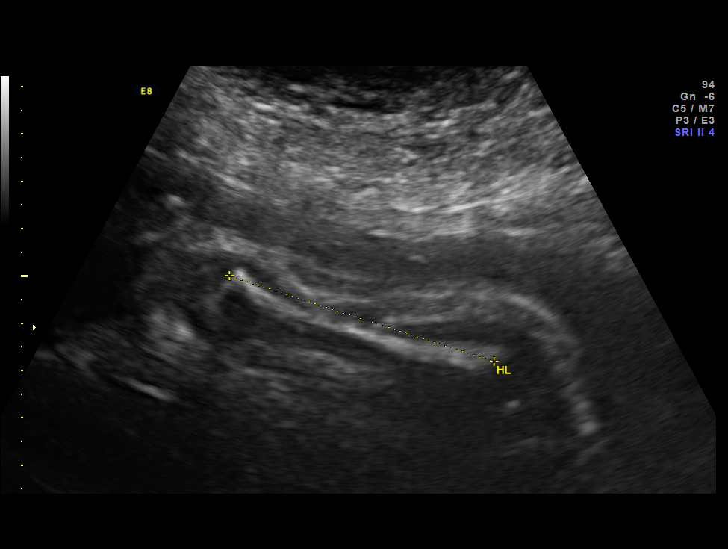
[im 12/26]
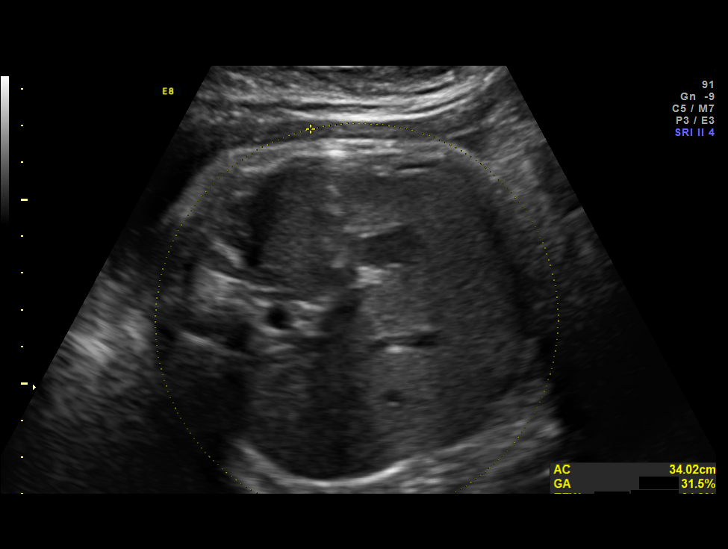
[im 15/26]
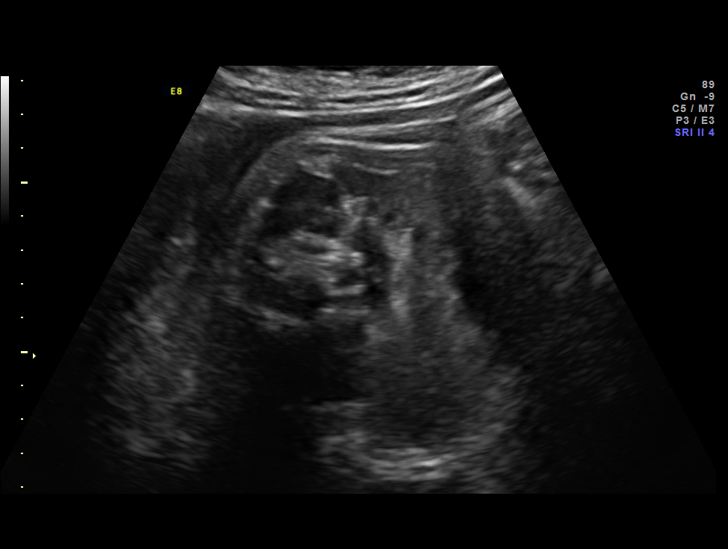
[im 17/26]
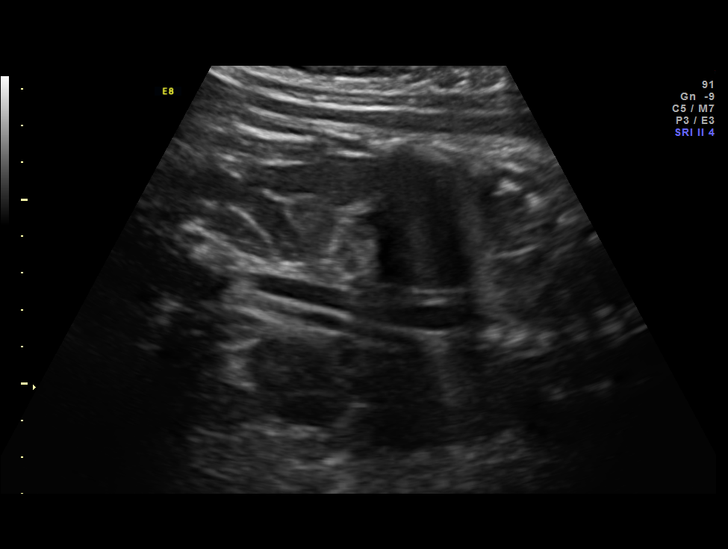
[im 19/26]
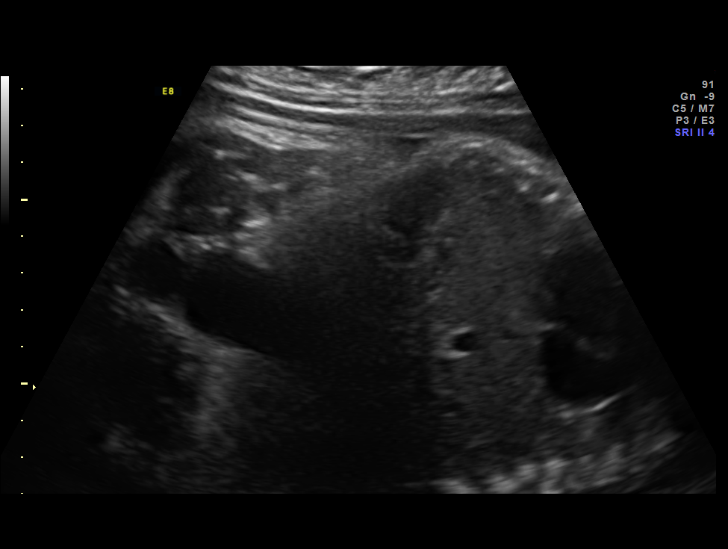
[im 21/26]
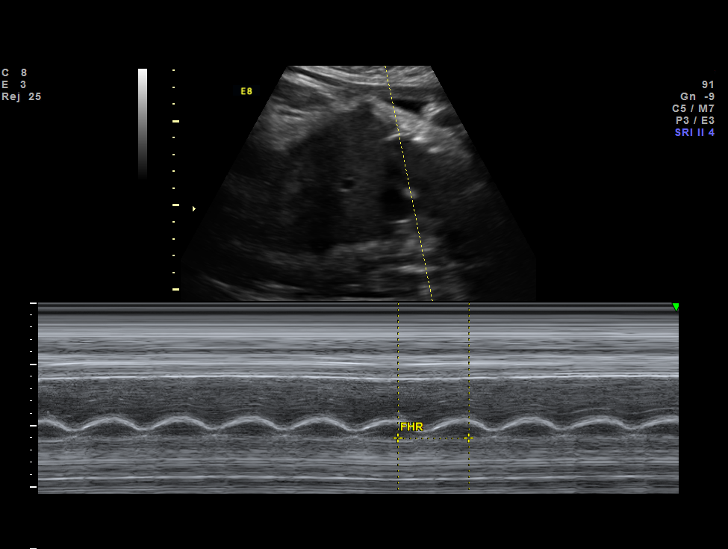
[im 23/26]
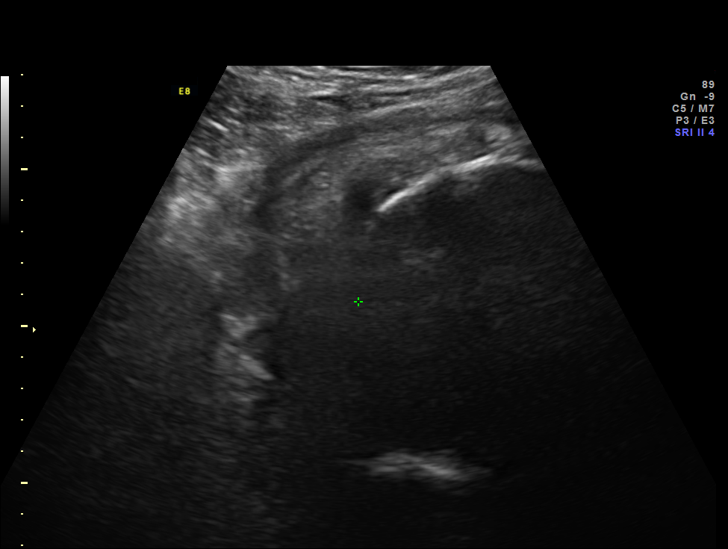
[im 25/26]
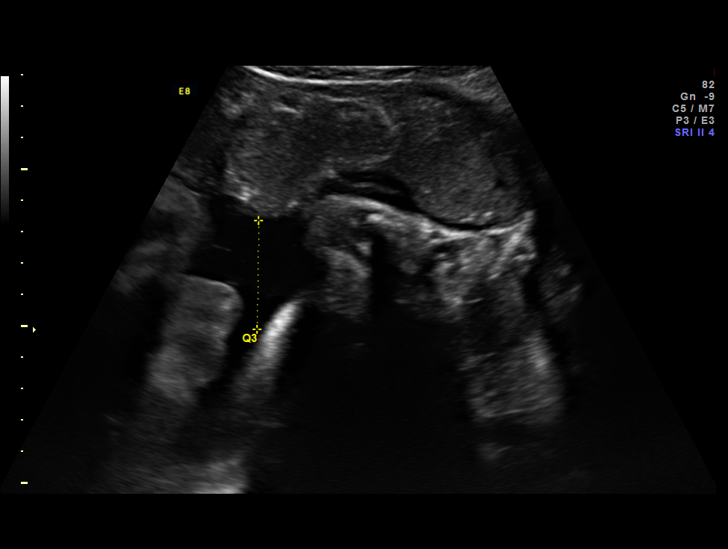

[12 of 26 positions shown; findings below may reference images not displayed]

OBSTETRICS REPORT
(Signed Final 05/02/2015 [DATE])

Name:       DERRELL SYED                         Visit  05/02/2015 [DATE]
Date:

Service(s) Provided

Indications

Obesity complicating pregnancy
Tobacco use complicating pregnancy, third
trimester
39 weeks gestation of pregnancy
Fetal Evaluation

Num Of             1
Fetuses:
Fetal Heart        142                          bpm
Rate:
Cardiac Activity:  Observed
Presentation:      Cephalic
Placenta:          Anterior, above cervical
os
P. Cord            Previously Visualized
Insertion:

Amniotic Fluid
AFI FV:      Subjectively within normal limits
AFI Sum:     13       cm      52  %Tile     Larg Pckt:    6.75   cm
RUQ:   2.78    cm    LUQ:   6.75    cm   LLQ:    3.47    cm
Biometry

BPD:    100.3   m    G. Age:   41w 2d                 CI:         80.0   70 - 86
m
OFD:    125.4   m                                     FL/HC:      18.9   20.6 -
m
HC:     359.9   m    G. Age:   N/A           94  %    HC/AC:      1.05   0.87 -
m
AC:     341.8   m    G. Age:   38w 1d        37  %    FL/BPD      67.7   71 - 87
m                                     :
FL:      67.9   m    G. Age:   34w 6d       < 3  %    FL/AC:      19.9   20 - 24
m
HUM:       59   m    G. Age:   34w 1d       < 5  %
m
Est.        7797   gm    7 lb 9 oz      67   %
FW:
Gestational Age

LMP:           39w 3d        Date:  07/30/14                  EDD:   05/06/15
U/S Today:     38w 1d                                         EDD:   05/15/15
Best:          39w 3d    Det. By:   LMP  (07/30/14)           EDD:   05/06/15
Anatomy

Cranium:          Previously seen        Aortic Arch:       Previously seen
Fetal Cavum:      Previously seen        Ductal Arch:       Previously seen
Ventricles:       Appears normal         Diaphragm:         Appears normal
Choroid Plexus:   Previously seen        Stomach:           Appears normal,
left sided
Cerebellum:       Previously seen        Abdomen:           Previously seen
Posterior         Previously seen        Abdominal          Previously seen
Fossa:                                   Wall:
Nuchal Fold:      Not applicable (>20    Cord Vessels:      Previously seen
wks GA)
Face:             Orbits nl; profile     Kidneys:           Appear normal
not well visualized
Lips:             Previously seen        Bladder:           Appears normal
Heart:            Previously seen        Spine:             Previously seen
RVOT:             Previously seen        Lower              Previously seen
Extremities:
LVOT:             Previously seen        Upper              Previously seen
Extremities:

Other:   Male gender.
Cervix Uterus Adnexa

Cervix:       Not visualized (advanced GA >54wks)
Impression

Singleton intrauterine pregnancy at 39 weeks 3 days
gestation with fetal cardiac activity
Cephalic presentation
Normal appearing fetal growth and amniotic fluid volume

---------------------------------------------------------------------- Recommendations

Follow-up ultrasounds as clinically indicated.

## 2016-12-21 ENCOUNTER — Other Ambulatory Visit: Payer: Self-pay | Admitting: Obstetrics

## 2016-12-29 ENCOUNTER — Encounter: Payer: Self-pay | Admitting: Gynecology

## 2017-01-25 ENCOUNTER — Other Ambulatory Visit (INDEPENDENT_AMBULATORY_CARE_PROVIDER_SITE_OTHER): Payer: Medicaid Other | Admitting: *Deleted

## 2017-01-25 ENCOUNTER — Encounter: Payer: Self-pay | Admitting: *Deleted

## 2017-01-25 DIAGNOSIS — Z3201 Encounter for pregnancy test, result positive: Secondary | ICD-10-CM

## 2017-01-25 DIAGNOSIS — N926 Irregular menstruation, unspecified: Secondary | ICD-10-CM

## 2017-01-25 DIAGNOSIS — N912 Amenorrhea, unspecified: Secondary | ICD-10-CM | POA: Diagnosis not present

## 2017-01-25 LAB — POCT URINE PREGNANCY: Preg Test, Ur: POSITIVE — AB

## 2017-01-25 NOTE — Progress Notes (Signed)
Pt is in office today for UPT / confirmation. Pt UPT in office today is positive. Pt states LMP of 12/22/16 dating her at 3993w6d, with EDD of 09/28/17.  Pt states she was previously informed she may have UTI. Udip done in office today, negative results. Pt made aware. Pt is currently on PNV, advised to continue. Pt advised of any reason to seek emergent care.  Pt states she works for [employer] and ask about weight limits. Pt advised that recommended lifting weight to be no more than 20-25 pounds. Pt advised if she is having any symptoms/ problems while at work she should be seen or contact office. Pt advised work conditions can be discussed at NOB appt or if she needs any information for employer sooner to contact office.  Pt states understanding. Pt to be set up for NOB visit and be given confirm letter at check out today.

## 2017-03-03 ENCOUNTER — Encounter: Payer: Self-pay | Admitting: *Deleted

## 2017-03-03 ENCOUNTER — Other Ambulatory Visit (HOSPITAL_COMMUNITY)
Admission: RE | Admit: 2017-03-03 | Discharge: 2017-03-03 | Disposition: A | Discharge status: Home / Self Care | Payer: Medicaid Other | Source: Ambulatory Visit | Attending: Certified Nurse Midwife | Admitting: Certified Nurse Midwife

## 2017-03-03 ENCOUNTER — Encounter: Payer: Self-pay | Admitting: Certified Nurse Midwife

## 2017-03-03 ENCOUNTER — Ambulatory Visit (INDEPENDENT_AMBULATORY_CARE_PROVIDER_SITE_OTHER): Payer: Medicaid Other | Admitting: Certified Nurse Midwife

## 2017-03-03 VITALS — Wt 190.8 lb

## 2017-03-03 DIAGNOSIS — Z98891 History of uterine scar from previous surgery: Secondary | ICD-10-CM

## 2017-03-03 DIAGNOSIS — Z3481 Encounter for supervision of other normal pregnancy, first trimester: Secondary | ICD-10-CM | POA: Insufficient documentation

## 2017-03-03 DIAGNOSIS — Z3480 Encounter for supervision of other normal pregnancy, unspecified trimester: Secondary | ICD-10-CM

## 2017-03-03 DIAGNOSIS — O34219 Maternal care for unspecified type scar from previous cesarean delivery: Secondary | ICD-10-CM

## 2017-03-03 DIAGNOSIS — Z3A10 10 weeks gestation of pregnancy: Secondary | ICD-10-CM | POA: Diagnosis not present

## 2017-03-03 DIAGNOSIS — O219 Vomiting of pregnancy, unspecified: Secondary | ICD-10-CM

## 2017-03-03 DIAGNOSIS — H913 Deaf nonspeaking, not elsewhere classified: Secondary | ICD-10-CM

## 2017-03-03 DIAGNOSIS — Z3689 Encounter for other specified antenatal screening: Secondary | ICD-10-CM | POA: Diagnosis not present

## 2017-03-03 MED ORDER — DOXYLAMINE-PYRIDOXINE ER 20-20 MG PO TBCR: 1.0000 | EXTENDED_RELEASE_TABLET | Freq: Two times a day (BID) | ORAL | 6 refills | Status: DC

## 2017-03-03 MED ORDER — PRENATE PIXIE 10-0.6-0.4-200 MG PO CAPS: 1.0000 | ORAL_CAPSULE | Freq: Every day | ORAL | 12 refills | Status: AC

## 2017-03-04 ENCOUNTER — Other Ambulatory Visit: Payer: Self-pay | Admitting: Certified Nurse Midwife

## 2017-03-04 DIAGNOSIS — Z3480 Encounter for supervision of other normal pregnancy, unspecified trimester: Secondary | ICD-10-CM

## 2017-03-04 LAB — CYTOLOGY - PAP: DIAGNOSIS: NEGATIVE

## 2017-03-04 LAB — CERVICOVAGINAL ANCILLARY ONLY
Bacterial vaginitis: NEGATIVE
Candida vaginitis: NEGATIVE
Chlamydia: NEGATIVE
Neisseria Gonorrhea: NEGATIVE
Trichomonas: NEGATIVE

## 2017-03-04 NOTE — Progress Notes (Signed)
Subjective:    Rebecca Schmitt is being seen today for her first obstetrical visit.  This is a planned pregnancy. She is at 2680w2d gestation. Her obstetrical history is significant for smoker and prior C-section X1. Relationship with FOB: spouse, living together. Patient does intend to breast feed. Pregnancy history fully reviewed.  The information documented in the HPI was reviewed and verified.  Menstrual History: OB History    Gravida Para Term Preterm AB Living   2 1 1     1    SAB TAB Ectopic Multiple Live Births         0 1       Patient's last menstrual period was 12/22/2016.    Past Medical History:  Diagnosis Date  . Complication of anesthesia    wakes up   . Deaf-mutism   . Former smoker     Past Surgical History:  Procedure Laterality Date  . CESAREAN SECTION N/A 05/15/2015   Procedure: CESAREAN SECTION;  Surgeon: Brock Badharles A Harper, MD;  Location: [facility];  Service: Obstetrics;  Laterality: N/A;     (Not in a hospital admission) Allergies  Allergen Reactions  . Estrogens Hives    Allergy not certain    Social History  Substance Use Topics  . Smoking status: Former Smoker    Quit date: 04/19/2014  . Smokeless tobacco: Never Used  . Alcohol use No     Comment: rare    Family History  Problem Relation Age of Onset  . Hypertension Father   . Hypertension Mother   . Hypertension Maternal Grandmother   . Hypertension Maternal Grandfather   . Hypertension Paternal Grandmother   . Diabetes Paternal Grandmother   . Hypertension Paternal Grandfather   . Diabetes Paternal Grandfather      Review of Systems Constitutional: negative for weight loss Gastrointestinal: negative for vomiting, +nausea Genitourinary:negative for genital lesions and vaginal discharge and dysuria Musculoskeletal:negative for back pain Behavioral/Psych: negative for abusive relationship, depression, illegal drug usage and tobacco use    Objective:    Wt 190 lb 12.8 oz (86.5 kg)   LMP  12/22/2016   BMI 31.27 kg/m  General Appearance:    Alert, cooperative, no distress, appears stated age  Head:    Normocephalic, without obvious abnormality, atraumatic  Eyes:    PERRL, conjunctiva/corneas clear, EOM's intact, fundi    benign, both eyes  Ears:    Normal TM's and external ear canals, both ears  Nose:   Nares normal, septum midline, mucosa normal, no drainage    or sinus tenderness  Throat:   Lips, mucosa, and tongue normal; teeth and gums normal  Neck:   Supple, symmetrical, trachea midline, no adenopathy;    thyroid:  no enlargement/tenderness/nodules; no carotid   bruit or JVD  Back:     Symmetric, no curvature, ROM normal, no CVA tenderness  Lungs:     Clear to auscultation bilaterally, respirations unlabored  Chest Wall:    No tenderness or deformity   Heart:    Regular rate and rhythm, S1 and S2 normal, no murmur, rub   or gallop  Breast Exam:    No tenderness, masses, or nipple abnormality  Abdomen:     Soft, non-tender, bowel sounds active all four quadrants,    no masses, no organomegaly  Genitalia:    Normal female without lesion, discharge or tenderness  Extremities:   Extremities normal, atraumatic, no cyanosis or edema  Pulses:   2+ and symmetric all extremities  Skin:   Skin color, texture, turgor normal, no rashes or lesions  Lymph nodes:   Cervical, supraclavicular, and axillary nodes normal  Neurologic:   CNII-XII intact, normal strength, sensation and reflexes    throughout         Cervix: long, thick, closed and posterior.  Retroverted uterus.  FHT not heard with   doppler.  US performed for fetal heart tones and confirmation of dates.    Ultrasounds Results Note  Indication Unable to obtain FHTs with doppler.  Limited OB US performed.   Review of Systems As above.   Ultrasound view: Good Physical Exam  Single live embryo with CRL [redacted]w[redacted]d.  Fetal heart action present with FHR: 160's.   +FM noted.   Nasal bone present.  Eye orbits:  present Skull/brain appears normal.  Spine appears normal, abdomen apperas normal, hands both visible, feet both visible.   Placenta: posterior Amniotic fluid: normal  ASSESSMENT Singleton pregnancy with dates c/w LMP  PLAN Continue current care. EDD: 09/28/17  Roe Coombs, CNM  03/04/2017  8:05 AM          Lab Review Urine pregnancy test Labs reviewed yes Radiologic studies reviewed no  Assessment & Plan    Pregnancy at [redacted]w[redacted]d weeks      1. Supervision of other normal pregnancy, antepartum      - Cytology - PAP - Cervicovaginal ancillary only - Obstetric Panel, Including HIV - Hemoglobinopathy evaluation - Vitamin D (25 hydroxy) - Hemoglobin A1c - Varicella zoster antibody, IgG - MaterniT21 PLUS Core+SCA - Culture, OB Urine - Cystic Fibrosis Mutation 97 - Prenat-FeAsp-Meth-FA-DHA w/o A (PRENATE PIXIE) 10-0.6-0.4-200 MG CAPS; Take 1 tablet by mouth daily.  Dispense: 30 capsule; Refill: 12  2. Nausea and vomiting during pregnancy prior to [redacted] weeks gestation    - Doxylamine-Pyridoxine ER (BONJESTA) 20-20 MG TBCR; Take 1 tablet by mouth 2 (two) times daily.  Dispense: 60 tablet; Refill: 6  3. Previous c-section X1 VBAC discussed/ TOLAC form completed.   Prenatal vitamins.  Counseling provided regarding continued use of seat belts, cessation of alcohol consumption, smoking or use of illicit drugs; infection precautions i.e., influenza/TDAP immunizations, toxoplasmosis,CMV, parvovirus, listeria and varicella; workplace safety, exercise during pregnancy; routine dental care, safe medications, sexual activity, hot tubs, saunas, pools, travel, caffeine use, fish and methlymercury, potential toxins, hair treatments, varicose veins Weight gain recommendations per IOM guidelines reviewed: underweight/BMI< 18.5--> gain 28 - 40 lbs; normal weight/BMI 18.5 - 24.9--> gain 25 - 35 lbs; overweight/BMI 25 - 29.9--> gain 15 - 25 lbs; obese/BMI >30->gain  11 - 20 lbs Problem  list reviewed and updated. FIRST/CF mutation testing/NIPT/QUAD SCREEN/fragile X/Ashkenazi Jewish population testing/Spinal muscular atrophy discussed: ordered. Role of ultrasound in pregnancy discussed; fetal survey: requested. Amniocentesis discussed: not indicated. VBAC Flamm calculator score: 2, 49% successful VBAC VBAC consent form provided Meds ordered this encounter  Medications  . Prenat-FeAsp-Meth-FA-DHA w/o A (PRENATE PIXIE) 10-0.6-0.4-200 MG CAPS    Sig: Take 1 tablet by mouth daily.    Dispense:  30 capsule    Refill:  12    Please process coupon: Rx BIN: [id number], RxPCN: [id number], RxGRP: [id number], RxID: [id number]  SUF: 01  . Doxylamine-Pyridoxine ER (BONJESTA) 20-20 MG TBCR    Sig: Take 1 tablet by mouth 2 (two) times daily.    Dispense:  60 tablet    Refill:  6   Orders Placed This Encounter  Procedures  . Culture, OB Urine  . Obstetric Panel, Including HIV  . Hemoglobinopathy  evaluation  . Vitamin D (25 hydroxy)  . Hemoglobin A1c  . Varicella zoster antibody, IgG  . MaterniT21 PLUS Core+SCA    Order Specific Question:   Is patient insulin dependent?    Answer:   No    Order Specific Question:   Weight (lbs)    Answer:   190    Order Specific Question:   Gestational Age (GA), weeks    Answer:   10    Order Specific Question:   Date on which patient was at this GA    Answer:   03/03/2017    Order Specific Question:   GA Calculation Method    Answer:   LMP    Order Specific Question:   GA Date    Answer:   09/28/2016    Order Specific Question:   Number of fetuses    Answer:   1    Order Specific Question:   Donor egg?    Answer:   N    Order Specific Question:   Age of egg donor?    Answer:   30  . Cystic Fibrosis Mutation 97    Follow up in 4 weeks. 50% of 45 + min visit spent on counseling and coordination of care.

## 2017-03-05 LAB — OBSTETRIC PANEL, INCLUDING HIV
ANTIBODY SCREEN: NEGATIVE
BASOS: 0 %
Basophils Absolute: 0 10*3/uL (ref 0.0–0.2)
EOS (ABSOLUTE): 0.1 10*3/uL (ref 0.0–0.4)
EOS: 1 %
HEMATOCRIT: 40.5 % (ref 34.0–46.6)
HEMOGLOBIN: 13.4 g/dL (ref 11.1–15.9)
HEP B S AG: NEGATIVE
HIV Screen 4th Generation wRfx: NONREACTIVE
IMMATURE GRANS (ABS): 0 10*3/uL (ref 0.0–0.1)
Immature Granulocytes: 0 %
LYMPHS: 28 %
Lymphocytes Absolute: 1.9 10*3/uL (ref 0.7–3.1)
MCH: 29.2 pg (ref 26.6–33.0)
MCHC: 33.1 g/dL (ref 31.5–35.7)
MCV: 88 fL (ref 79–97)
MONOCYTES: 7 %
Monocytes Absolute: 0.5 10*3/uL (ref 0.1–0.9)
NEUTROS PCT: 64 %
Neutrophils Absolute: 4.5 10*3/uL (ref 1.4–7.0)
Platelets: 117 10*3/uL — ABNORMAL LOW (ref 150–379)
RBC: 4.59 x10E6/uL (ref 3.77–5.28)
RDW: 13.4 % (ref 12.3–15.4)
RH TYPE: POSITIVE
RPR: NONREACTIVE
RUBELLA: 4.79 {index} (ref >0.99)
WBC: 7 10*3/uL (ref 3.4–10.8)

## 2017-03-05 LAB — VITAMIN D 25 HYDROXY (VIT D DEFICIENCY, FRACTURES): Vit D, 25-Hydroxy: 26.1 ng/mL — ABNORMAL LOW (ref 30.0–100.0)

## 2017-03-05 LAB — CULTURE, OB URINE

## 2017-03-05 LAB — VARICELLA ZOSTER ANTIBODY, IGG: VARICELLA: 399 {index} (ref >165)

## 2017-03-05 LAB — URINE CULTURE, OB REFLEX

## 2017-03-05 LAB — HEMOGLOBINOPATHY EVALUATION
HEMOGLOBIN A2 QUANTITATION: 2.6 % (ref 1.8–3.2)
HGB C: 0 %
HGB S: 0 %
HGB VARIANT: 0 %
Hemoglobin F Quantitation: 0.5 % (ref 0.0–2.0)
Hgb A: 96.9 % (ref 96.4–98.8)

## 2017-03-05 LAB — HEMOGLOBIN A1C
Est. average glucose Bld gHb Est-mCnc: 88 mg/dL
Hgb A1c MFr Bld: 4.7 % — ABNORMAL LOW (ref 4.8–5.6)

## 2017-03-07 ENCOUNTER — Other Ambulatory Visit: Payer: Self-pay | Admitting: Certified Nurse Midwife

## 2017-03-07 ENCOUNTER — Encounter: Payer: Self-pay | Admitting: Certified Nurse Midwife

## 2017-03-07 DIAGNOSIS — R7989 Other specified abnormal findings of blood chemistry: Secondary | ICD-10-CM | POA: Insufficient documentation

## 2017-03-07 DIAGNOSIS — Z3480 Encounter for supervision of other normal pregnancy, unspecified trimester: Secondary | ICD-10-CM

## 2017-03-07 MED ORDER — VITAMIN D (ERGOCALCIFEROL) 1.25 MG (50000 UNIT) PO CAPS: 50000.0000 [IU] | ORAL_CAPSULE | ORAL | 2 refills | Status: AC

## 2017-03-08 ENCOUNTER — Other Ambulatory Visit: Payer: Self-pay | Admitting: Certified Nurse Midwife

## 2017-03-08 DIAGNOSIS — Z3480 Encounter for supervision of other normal pregnancy, unspecified trimester: Secondary | ICD-10-CM

## 2017-03-08 LAB — MATERNIT21 PLUS CORE+SCA
CHROMOSOME 13: NEGATIVE
CHROMOSOME 21: NEGATIVE
Chromosome 18: NEGATIVE
Y CHROMOSOME: DETECTED

## 2017-03-10 ENCOUNTER — Other Ambulatory Visit: Payer: Self-pay | Admitting: Certified Nurse Midwife

## 2017-03-10 DIAGNOSIS — Z3480 Encounter for supervision of other normal pregnancy, unspecified trimester: Secondary | ICD-10-CM

## 2017-03-10 LAB — CYSTIC FIBROSIS MUTATION 97: Interpretation: NOT DETECTED

## 2017-03-14 ENCOUNTER — Other Ambulatory Visit: Payer: Self-pay | Admitting: *Deleted

## 2017-03-14 DIAGNOSIS — O219 Vomiting of pregnancy, unspecified: Secondary | ICD-10-CM

## 2017-03-14 MED ORDER — DOXYLAMINE-PYRIDOXINE 10-10 MG PO TBEC: 1.0000 | DELAYED_RELEASE_TABLET | Freq: Two times a day (BID) | ORAL | 99 refills | Status: AC

## 2017-03-31 ENCOUNTER — Encounter: Payer: Medicaid Other | Admitting: Certified Nurse Midwife

## 2017-05-10 ENCOUNTER — Other Ambulatory Visit: Payer: Self-pay | Admitting: Obstetrics

## 2017-12-21 ENCOUNTER — Encounter (HOSPITAL_COMMUNITY): Payer: Self-pay
